# Patient Record
Sex: Female | Born: 1985 | Race: Black or African American | Hispanic: No | Smoking: Never smoker
Health system: Southern US, Community
[De-identification: ages and names within clinical notes are randomized; demographics above are authoritative.]

## PROBLEM LIST (undated history)

## (undated) DIAGNOSIS — F32A Depression, unspecified: Secondary | ICD-10-CM

## (undated) DIAGNOSIS — F1010 Alcohol abuse, uncomplicated: Secondary | ICD-10-CM

## (undated) DIAGNOSIS — F419 Anxiety disorder, unspecified: Secondary | ICD-10-CM

## (undated) DIAGNOSIS — S62609A Fracture of unspecified phalanx of unspecified finger, initial encounter for closed fracture: Secondary | ICD-10-CM

## (undated) DIAGNOSIS — F329 Major depressive disorder, single episode, unspecified: Secondary | ICD-10-CM

## (undated) HISTORY — DX: Depression, unspecified: F32.A

## (undated) HISTORY — DX: Major depressive disorder, single episode, unspecified: F32.9

---

## 2007-03-31 HISTORY — PX: GASTRIC BYPASS: SHX52

## 2012-02-21 ENCOUNTER — Encounter (HOSPITAL_COMMUNITY): Payer: Self-pay

## 2012-02-21 ENCOUNTER — Emergency Department (HOSPITAL_COMMUNITY)
Admission: EM | Admit: 2012-02-21 | Discharge: 2012-02-21 | Disposition: A | Discharge status: Home / Self Care | Payer: Medicaid Other | Source: Home / Self Care | Attending: Family Medicine | Admitting: Family Medicine

## 2012-02-21 DIAGNOSIS — K0889 Other specified disorders of teeth and supporting structures: Secondary | ICD-10-CM

## 2012-02-21 DIAGNOSIS — K089 Disorder of teeth and supporting structures, unspecified: Secondary | ICD-10-CM

## 2012-02-21 MED ORDER — GABAPENTIN 300 MG PO CAPS: 300.0000 mg | ORAL_CAPSULE | Freq: Three times a day (TID) | ORAL | Status: DC

## 2012-02-21 MED ORDER — KETOROLAC TROMETHAMINE 30 MG/ML IJ SOLN
30.0000 mg | Freq: Once | INTRAMUSCULAR | Status: AC
  Administered 2012-02-21: 30 mg via INTRAMUSCULAR

## 2012-02-21 MED ORDER — METHYLPREDNISOLONE 4 MG PO KIT: PACK | ORAL | Status: DC

## 2012-02-21 MED ORDER — KETOROLAC TROMETHAMINE 30 MG/ML IJ SOLN
INTRAMUSCULAR | Status: AC
  Filled 2012-02-21: qty 1

## 2012-02-21 NOTE — ED Notes (Signed)
Episodic HA since dental work 3 weeks ago; HA worse w lights, hot or cold food/beverages

## 2012-02-21 NOTE — ED Provider Notes (Signed)
History     CSN: [id number]  Arrival date & time 02/21/12  1043   First MD Initiated Contact with Patient 02/21/12 1107      Chief Complaint  Patient presents with  . Headache    (Consider location/radiation/quality/duration/timing/severity/associated sxs/prior treatment) Patient is a 26 y.o. female presenting with tooth pain. The history is provided by the patient.  Dental PainThe primary symptoms include mouth pain and headaches. The symptoms began more than 1 week ago (since 3 weeks ago receiving multiple injections for pain relief from dental filling procedure,). The symptoms are worsening. The symptoms occur constantly.  Additional symptoms include: dental sensitivity to temperature. Additional symptoms do not include: trismus, jaw pain, facial swelling and trouble swallowing.    History reviewed. No pertinent past medical history.  Past Surgical History  Procedure Date  . Abdominal surgery   . Gastric bypass     History reviewed. No pertinent family history.  History  Substance Use Topics  . Smoking status: Not on file  . Smokeless tobacco: Not on file  . Alcohol Use:     OB History    Grav Para Term Preterm Abortions TAB SAB Ect Mult Living                  Review of Systems  Constitutional: Negative.   HENT: Positive for dental problem. Negative for facial swelling and trouble swallowing.   Neurological: Positive for headaches.    Allergies  Diflucan  Home Medications   Current Outpatient Rx  Name  Route  Sig  Dispense  Refill  . CITALOPRAM HYDROBROMIDE 40 MG PO TABS   Oral   Take 40 mg by mouth daily.         Marland Kitchen VALACYCLOVIR HCL 1 G PO TABS   Oral   Take 500 mg by mouth daily.         Marland Kitchen GABAPENTIN 300 MG PO CAPS   Oral   Take 1 capsule (300 mg total) by mouth 3 (three) times daily.   30 capsule   0   . METHYLPREDNISOLONE 4 MG PO KIT      follow package directions, take all of medicine starting on Monday until finished   21 tablet   0     BP 127/84  Pulse 71  Temp 98.6 F (37 C) (Oral)  Resp 18  SpO2 100%  Physical Exam  Nursing note and vitals reviewed. Constitutional: She is oriented to person, place, and time. She appears well-developed and well-nourished.  HENT:  Head: Normocephalic.  Right Ear: External ear normal.  Left Ear: External ear normal.  Mouth/Throat: Oropharynx is clear and moist.       Dental hypersensitivity , no gingival sts or erythema.  Eyes: Conjunctivae normal are normal. Pupils are equal, round, and reactive to light.  Neck: Normal range of motion. Neck supple.  Lymphadenopathy:    She has no cervical adenopathy.  Neurological: She is alert and oriented to person, place, and time.  Skin: Skin is warm and dry.    ED Course  Procedures (including critical care time)  Labs Reviewed - No data to display No results found.   1. Pain, dental       MDM          Linna Hoff, MD 02/21/12 [phone]

## 2012-04-20 ENCOUNTER — Encounter (HOSPITAL_COMMUNITY): Payer: Self-pay | Admitting: Emergency Medicine

## 2012-04-20 ENCOUNTER — Emergency Department (HOSPITAL_COMMUNITY)
Admission: EM | Admit: 2012-04-20 | Discharge: 2012-04-20 | Disposition: A | Discharge status: Home / Self Care | Payer: Medicaid Other | Source: Home / Self Care

## 2012-04-20 ENCOUNTER — Emergency Department (HOSPITAL_COMMUNITY)
Admission: EM | Admit: 2012-04-20 | Discharge: 2012-04-20 | Disposition: A | Discharge status: Home / Self Care | Payer: Medicaid Other | Source: Home / Self Care | Attending: Emergency Medicine | Admitting: Emergency Medicine

## 2012-04-20 DIAGNOSIS — J069 Acute upper respiratory infection, unspecified: Secondary | ICD-10-CM

## 2012-04-20 MED ORDER — DIPHENHYDRAMINE HCL 25 MG PO CAPS: 25.0000 mg | ORAL_CAPSULE | Freq: Four times a day (QID) | ORAL | Status: DC | PRN

## 2012-04-20 MED ORDER — PSEUDOEPHEDRINE HCL 30 MG/5ML PO SYRP: 30.0000 mg | ORAL_SOLUTION | Freq: Four times a day (QID) | ORAL | Status: DC | PRN

## 2012-04-20 NOTE — ED Notes (Signed)
Pt c/o cold sx since yest Sx include: sinus pressure, sneezing, dry cough, itchy eyes, runny nose, nasal congestion Denies: f/v/n/d  She is alert w/no signs of acute distress.

## 2012-04-20 NOTE — ED Provider Notes (Signed)
Medical screening examination/treatment/procedure(s) were performed by resident-physician practitioner and as supervising physician I was immediately available for consultation/collaboration.  Raynald Blend, MD 04/20/12 [phone]

## 2012-04-20 NOTE — ED Provider Notes (Signed)
History     CSN: [id number]  Arrival date & time 04/20/12  1631   First MD Initiated Contact with Patient 04/20/12 1721      Chief Complaint  Patient presents with  . URI    (Consider location/radiation/quality/duration/timing/severity/associated sxs/prior treatment) Patient is a 27 y.o. female presenting with URI. The history is provided by the patient.  URI The primary symptoms include fatigue, sore throat and cough. Primary symptoms do not include fever, headaches, ear pain, swollen glands, wheezing, abdominal pain, nausea, vomiting, myalgias, arthralgias or rash. The current episode started yesterday. This is a new problem. The problem has been gradually worsening.  The sore throat is mild in intensity. The sore throat is not accompanied by trouble swallowing, drooling, hoarse voice or stridor.  The cough is non-productive.  The onset of the illness is associated with exposure to sick contacts. Symptoms associated with the illness include congestion and rhinorrhea. The illness is not associated with chills, plugged ear sensation, facial pain or sinus pressure.    History reviewed. No pertinent past medical history.  Past Surgical History  Procedure Date  . Abdominal surgery   . Gastric bypass     No family history on file.  History  Substance Use Topics  . Smoking status: Never Smoker   . Smokeless tobacco: Not on file  . Alcohol Use: Yes    OB History    Grav Para Term Preterm Abortions TAB SAB Ect Mult Living                  Review of Systems  Constitutional: Positive for fatigue. Negative for fever and chills.  HENT: Positive for congestion, sore throat and rhinorrhea. Negative for ear pain, hoarse voice, drooling, trouble swallowing and sinus pressure.   Eyes: Negative for discharge and visual disturbance.  Respiratory: Positive for cough. Negative for wheezing and stridor.   Cardiovascular: Negative for chest pain and leg swelling.  Gastrointestinal:  Negative for nausea, vomiting, abdominal pain and abdominal distention.  Genitourinary: Negative for flank pain.  Musculoskeletal: Negative for myalgias, back pain and arthralgias.  Skin: Negative for rash.  Neurological: Negative for light-headedness and headaches.  Hematological: Negative for adenopathy. Does not bruise/bleed easily.  Psychiatric/Behavioral: Negative for confusion and agitation.    Allergies  Diflucan  Home Medications   Current Outpatient Rx  Name  Route  Sig  Dispense  Refill  . CITALOPRAM HYDROBROMIDE 40 MG PO TABS   Oral   Take 40 mg by mouth daily.         Marland Kitchen VALACYCLOVIR HCL 1 G PO TABS   Oral   Take 500 mg by mouth daily.         Marland Kitchen GABAPENTIN 300 MG PO CAPS   Oral   Take 1 capsule (300 mg total) by mouth 3 (three) times daily.   30 capsule   0   . METHYLPREDNISOLONE 4 MG PO KIT      follow package directions, take all of medicine starting on Monday until finished   21 tablet   0     BP 129/88  Pulse 73  Temp 98.6 F (37 C) (Oral)  Resp 16  SpO2 100%  LMP 04/13/2012  Physical Exam  Constitutional: She is oriented to person, place, and time. She appears well-developed and well-nourished. No distress.  HENT:  Head: Normocephalic and atraumatic.  Right Ear: Tympanic membrane normal.  Left Ear: Tympanic membrane normal.  Nose: Mucosal edema and rhinorrhea present. No sinus tenderness. Right  sinus exhibits no maxillary sinus tenderness and no frontal sinus tenderness. Left sinus exhibits no maxillary sinus tenderness and no frontal sinus tenderness.  Mouth/Throat: Mucous membranes are normal. Normal dentition. No dental caries. No oropharyngeal exudate or posterior oropharyngeal edema.  Eyes: Conjunctivae normal and EOM are normal. Pupils are equal, round, and reactive to light.  Neck: Normal range of motion. Neck supple.  Cardiovascular: Normal rate and regular rhythm.  Exam reveals no gallop and no friction rub.   No murmur  heard. Pulmonary/Chest: Effort normal and breath sounds normal. No respiratory distress. She has no wheezes. She has no rales.  Abdominal: Soft. Bowel sounds are normal.  Musculoskeletal: Normal range of motion. She exhibits no edema.  Neurological: She is alert and oriented to person, place, and time.  Skin: Skin is warm and dry. She is not diaphoretic.    ED Course  Procedures (including critical care time)  Labs Reviewed - No data to display No results found.   No diagnosis found.    MDM  URI-symptomatic treatment discussed with patient. No red flags. Work note given.         Shelva Majestic, MD 04/20/12 [phone]

## 2012-06-15 ENCOUNTER — Encounter (HOSPITAL_COMMUNITY): Payer: Self-pay | Admitting: Emergency Medicine

## 2012-06-15 ENCOUNTER — Emergency Department (INDEPENDENT_AMBULATORY_CARE_PROVIDER_SITE_OTHER)
Admission: EM | Admit: 2012-06-15 | Discharge: 2012-06-15 | Disposition: A | Discharge status: Home / Self Care | Payer: PRIVATE HEALTH INSURANCE | Source: Home / Self Care | Attending: Emergency Medicine | Admitting: Emergency Medicine

## 2012-06-15 DIAGNOSIS — J069 Acute upper respiratory infection, unspecified: Secondary | ICD-10-CM

## 2012-06-15 HISTORY — DX: Anxiety disorder, unspecified: F41.9

## 2012-06-15 LAB — POCT RAPID STREP A: Streptococcus, Group A Screen (Direct): NEGATIVE

## 2012-06-15 MED ORDER — BENZONATATE 200 MG PO CAPS: 200.0000 mg | ORAL_CAPSULE | Freq: Three times a day (TID) | ORAL | Status: DC | PRN

## 2012-06-15 MED ORDER — FEXOFENADINE-PSEUDOEPHED ER 60-120 MG PO TB12: 1.0000 | ORAL_TABLET | Freq: Two times a day (BID) | ORAL | Status: DC

## 2012-06-15 NOTE — ED Notes (Signed)
Pt c/o cold sx onset 4 days Sx include: S.T, dysphagia, HA, cough w/yellow phlegm, nasal congestion, nauseas, diarrhea, loss of appetite Denies: fevers, vomiting Drinking fluids ok; Taking ibuprofen for the discomfort Sister sick and given antibiotics  She is alert w/no signs of acute respiratory distress.

## 2012-06-15 NOTE — ED Provider Notes (Signed)
Chief Complaint:   Chief Complaint  Patient presents with  . URI    History of Present Illness:   Lauren Love is a 27 year old female with a four-day history of headache, sore throat, cough productive yellow sputum, nasal congestion with bloody drainage, headache, sinus pressure, chills, weakness, anorexia, sore tongue, and popping or ears.  Review of Systems:  Other than noted above, the patient denies any of the following symptoms: Systemic:  No fevers, chills, sweats, weight loss or gain, fatigue, or tiredness. Eye:  No redness or discharge. ENT:  No ear pain, drainage, headache, nasal congestion, drainage, sinus pressure, difficulty swallowing, or sore throat. Neck:  No neck pain or swollen glands. Lungs:  No cough, sputum production, hemoptysis, wheezing, chest tightness, shortness of breath or chest pain. GI:  No abdominal pain, nausea, vomiting or diarrhea.  PMFSH:  Past medical history, family history, social history, meds, and allergies were reviewed.   Physical Exam:   Vital signs:  BP 132/89  Pulse 78  Temp(Src) 99 F (37.2 C) (Oral)  Resp 18  SpO2 100%  LMP 06/10/2012 General:  Alert and oriented.  In no distress.  Skin warm and dry. Eye:  No conjunctival injection or drainage. Lids were normal. ENT:  TMs and canals were normal, without erythema or inflammation.  Nasal mucosa was clear and uncongested, without drainage.  Mucous membranes were moist.  Pharynx was clear with no exudate or drainage.  There were no oral ulcerations or lesions. Neck:  Supple, no adenopathy, tenderness or mass. Lungs:  No respiratory distress.  Lungs were clear to auscultation, without wheezes, rales or rhonchi.  Breath sounds were clear and equal bilaterally.  Heart:  Regular rhythm, without gallops, murmers or rubs. Skin:  Clear, warm, and dry, without rash or lesions.   Labs:   Results for orders placed during the hospital encounter of 06/15/12  POCT RAPID STREP A ([facility] ONLY)       Result Value Range   Streptococcus, Group A Screen (Direct) NEGATIVE  NEGATIVE     Assessment:  The encounter diagnosis was Viral upper respiratory infection.  Plan:   1.  The following meds were prescribed:   Discharge Medication List as of 06/15/2012 12:21 PM    START taking these medications   Details  benzonatate (TESSALON) 200 MG capsule Take 1 capsule (200 mg total) by mouth 3 (three) times daily as needed for cough., Starting 06/15/2012, Until Discontinued, Normal    fexofenadine-pseudoephedrine (ALLEGRA-D) 60-120 MG per tablet Take 1 tablet by mouth every 12 (twelve) hours., Starting 06/15/2012, Until Discontinued, Normal       2.  The patient was instructed in symptomatic care and handouts were given. 3.  The patient was told to return if becoming worse in any way, if no better in 3 or 4 days, and given some red flag symptoms that would indicate earlier return.      Reuben Likes, MD 06/15/12 2141

## 2012-06-15 NOTE — ED Notes (Signed)
Pt would like Doctor's note

## 2012-08-27 ENCOUNTER — Other Ambulatory Visit (HOSPITAL_COMMUNITY)
Admission: RE | Admit: 2012-08-27 | Discharge: 2012-08-27 | Disposition: A | Discharge status: Home / Self Care | Payer: Self-pay | Source: Ambulatory Visit | Attending: Emergency Medicine | Admitting: Emergency Medicine

## 2012-08-27 ENCOUNTER — Emergency Department (INDEPENDENT_AMBULATORY_CARE_PROVIDER_SITE_OTHER)
Admission: EM | Admit: 2012-08-27 | Discharge: 2012-08-27 | Disposition: A | Discharge status: Home / Self Care | Payer: BC Managed Care – PPO | Source: Home / Self Care | Attending: Emergency Medicine | Admitting: Emergency Medicine

## 2012-08-27 ENCOUNTER — Encounter (HOSPITAL_COMMUNITY): Payer: Self-pay | Admitting: Emergency Medicine

## 2012-08-27 DIAGNOSIS — T192XXA Foreign body in vulva and vagina, initial encounter: Secondary | ICD-10-CM

## 2012-08-27 DIAGNOSIS — Z113 Encounter for screening for infections with a predominantly sexual mode of transmission: Secondary | ICD-10-CM | POA: Insufficient documentation

## 2012-08-27 DIAGNOSIS — N760 Acute vaginitis: Secondary | ICD-10-CM | POA: Insufficient documentation

## 2012-08-27 MED ORDER — METRONIDAZOLE 500 MG PO TABS: 500.0000 mg | ORAL_TABLET | Freq: Two times a day (BID) | ORAL | Status: DC

## 2012-08-27 MED ORDER — VALACYCLOVIR HCL 500 MG PO TABS: 500.0000 mg | ORAL_TABLET | Freq: Two times a day (BID) | ORAL | Status: DC

## 2012-08-27 NOTE — ED Notes (Signed)
Patient in gown and equipment at bedside

## 2012-08-27 NOTE — ED Provider Notes (Signed)
Chief Complaint:   Chief Complaint  Patient presents with  . Foreign Body in Vagina    History of Present Illness:   Lauren Love is a 27 year old female who presents today with a retained tampon. This is been present for about 2 days. She's had some discharge and some odor. She denies any pain or irritation. No fever, chills, nausea, vomiting, or urinary symptoms. Last menstrual period began a little over week ago. She's on a Mirena, but she would like to have this removed since she has about 2 menses per month. She also has a history of recurrent HSV. She was on Valtrex 500 mg per day for suppression, but then she moved to [place] has not been able to get this refilled. She would like to get back on it since she has an outbreak right now which came on with her menses.  Review of Systems:  Other than noted above, the patient denies any of the following symptoms: Systemic:  No fever, chills, sweats, or weight loss. GI:  No abdominal pain, nausea, anorexia, vomiting, diarrhea, constipation, melena or hematochezia. GU:  No dysuria, frequency, urgency, hematuria, vaginal discharge, itching, or abnormal vaginal bleeding. Skin:  No rash or itching.  PMFSH:  Past medical history, family history, social history, meds, and allergies were reviewed.  She is allergic to fluconazole. She takes Celexa.  Physical Exam:   Vital signs:  BP 119/84  Pulse 77  Temp(Src) 98.3 F (36.8 C) (Oral)  Resp 17  SpO2 99%  LMP 08/27/2012 General:  Alert, oriented and in no distress. Lungs:  Breath sounds clear and equal bilaterally.  No wheezes, rales or rhonchi. Heart:  Regular rhythm.  No gallops or murmers. Abdomen:  Soft, flat and non-distended.  No organomegaly or mass.  No tenderness, guarding or rebound.  Bowel sounds normally active. Pelvic exam:  Normal external genitalia. There is a retained tampon far up in the vaginal vault, near the cervix. This was easily removed with a ring forceps. Vaginal  mucosa was otherwise normal. There was no discharge but she did have some odor secondary to a retained tampon. The cervix appeared normal. No pain on cervical motion. Uterus was normal in size and shape and nontender. No adnexal masses or tenderness. Skin:  Clear, warm and dry.  Other Labs Obtained at Urgent Care Center:  DNA probes for gonorrhea, Chlamydia, Trichomonas, Gardnerella, Candida were obtained.  Results are pending at this time and we will call about any positive results.  Assessment:  The encounter diagnosis was Foreign body in vagina, initial encounter.  She had a tampon that was easily removed. She was given a refill for her Valtrex since this does seem to prevent the outbreaks of herpes simplex. I suggested she followup with a gynecologist for removal of the Mirena. Since she has some vaginal odor, we'll go ahead and treat with Flagyl for possible bacterial vaginosis as well.  Plan:   1.  The following meds were prescribed:   Discharge Medication List as of 08/27/2012  1:12 PM    START taking these medications   Details  metroNIDAZOLE (FLAGYL) 500 MG tablet Take 1 tablet (500 mg total) by mouth 2 (two) times daily., Starting 08/27/2012, Until Discontinued, Normal    !! valACYclovir (VALTREX) 500 MG tablet Take 1 tablet (500 mg total) by mouth 2 (two) times daily., Starting 08/27/2012, Until Discontinued, Normal     !! - Potential duplicate medications found. Please discuss with provider.     2.  The  patient was instructed in symptomatic care and handouts were given. 3.  The patient was told to return if becoming worse in any way, if no better in 3 or 4 days, and given some red flag symptoms such as pelvic pain or fever that would indicate earlier return. 4.  Follow up with a gynecologist for removal of the Mirena.    Reuben Likes, MD 08/27/12 1346

## 2012-08-27 NOTE — ED Notes (Signed)
Reports tampon in vagina since last night, unable to get tampon out.  Patient also requesting prescription for valcyclovir

## 2012-08-27 NOTE — ED Notes (Signed)
Patient dressing

## 2012-09-04 NOTE — ED Notes (Signed)
Final report of vaginal screening shows gardnerella positive, treated adquately; spoke w pt and advised of same

## 2012-09-04 NOTE — ED Notes (Signed)
Chart review.

## 2012-10-24 ENCOUNTER — Inpatient Hospital Stay (HOSPITAL_COMMUNITY)
Admission: AD | Admit: 2012-10-24 | Discharge: 2012-10-24 | Disposition: A | Discharge status: Home / Self Care | Payer: BC Managed Care – PPO | Source: Ambulatory Visit | Attending: Family Medicine | Admitting: Family Medicine

## 2012-10-24 DIAGNOSIS — L293 Anogenital pruritus, unspecified: Secondary | ICD-10-CM | POA: Insufficient documentation

## 2012-10-24 DIAGNOSIS — N926 Irregular menstruation, unspecified: Secondary | ICD-10-CM | POA: Insufficient documentation

## 2012-10-24 DIAGNOSIS — N949 Unspecified condition associated with female genital organs and menstrual cycle: Secondary | ICD-10-CM | POA: Insufficient documentation

## 2012-10-24 DIAGNOSIS — Z30431 Encounter for routine checking of intrauterine contraceptive device: Secondary | ICD-10-CM | POA: Insufficient documentation

## 2012-10-24 DIAGNOSIS — B373 Candidiasis of vulva and vagina: Secondary | ICD-10-CM

## 2012-10-24 DIAGNOSIS — B3731 Acute candidiasis of vulva and vagina: Secondary | ICD-10-CM

## 2012-10-24 LAB — URINALYSIS, ROUTINE W REFLEX MICROSCOPIC
Leukocytes, UA: NEGATIVE
Nitrite: NEGATIVE
Specific Gravity, Urine: 1.015 (ref 1.005–1.030)
Urobilinogen, UA: 4 mg/dL — ABNORMAL HIGH (ref 0.0–1.0)
pH: 7 (ref 5.0–8.0)

## 2012-10-24 LAB — URINE MICROSCOPIC-ADD ON

## 2012-10-24 LAB — WET PREP, GENITAL: Trich, Wet Prep: NONE SEEN

## 2012-10-24 MED ORDER — TERCONAZOLE 0.8 % VA CREA: 1.0000 | TOPICAL_CREAM | Freq: Every day | VAGINAL | Status: DC

## 2012-10-24 NOTE — MAU Provider Note (Signed)
Chart reviewed and agree with management and plan.  

## 2012-10-24 NOTE — MAU Note (Signed)
Patient is in with c/o vaginal irritation, worsen after intercourse. She also is considering removing her IUD, because she have vaginal bleeding every 2 weeks for 2 years.

## 2012-10-24 NOTE — MAU Provider Note (Signed)
History     CSN: [id number]  Arrival date and time: 10/24/12 1150   First Provider Initiated Contact with Patient 10/24/12 1222      Chief Complaint  Patient presents with  . Vaginal Discharge   HPI Ms. Lauren Love is a 27 y.o. female who presents to [facility] today with complaint of vaginal discharge and itching. The patient states white, mucus discharge without odor. She has irregular periods with the IUD and states periods every 2 weeks. She has had IUD x ~ 2 years. It was placed in [place], [place]. Patient is new to the area. She denies abdominal pain, N/V/D, UTI symptoms or fever.    OB History   Grav Para Term Preterm Abortions TAB SAB Ect Mult Living                  Past Medical History  Diagnosis Date  . Herpes   . Anxiety     Past Surgical History  Procedure Laterality Date  . Abdominal surgery    . Gastric bypass    . Cesarean section      No family history on file.  History  Substance Use Topics  . Smoking status: Never Smoker   . Smokeless tobacco: Not on file  . Alcohol Use: Yes    Allergies:  Allergies  Allergen Reactions  . Diflucan (Fluconazole) Hives    Prescriptions prior to admission  Medication Sig Dispense Refill  . citalopram (CELEXA) 40 MG tablet Take 40 mg by mouth daily.      Marland Kitchen desonide (DESOWEN) 0.05 % cream Apply 1 application topically 2 (two) times daily.      Marland Kitchen levonorgestrel (MIRENA) 20 MCG/24HR IUD 1 each by Intrauterine route once.      . valACYclovir (VALTREX) 1000 MG tablet Take 1,000 mg by mouth daily.         Review of Systems  Constitutional: Negative for fever and malaise/fatigue.  Gastrointestinal: Negative for nausea, vomiting, abdominal pain, diarrhea and constipation.  Genitourinary: Negative for dysuria, urgency and frequency.       Neg - vaginal bleeding + vaginal discharge   Physical Exam   Blood pressure 143/95, pulse 86, temperature 98.6 F (37 C), temperature source Oral, resp. rate 18, height 5\' 9"   (1.753 m), weight 198 lb 6 oz (89.982 kg), SpO2 98.00%.  Physical Exam  Constitutional: She is oriented to person, place, and time. She appears well-developed and well-nourished. No distress.  HENT:  Head: Normocephalic and atraumatic.  Cardiovascular: Normal rate, regular rhythm and normal heart sounds.   Respiratory: Effort normal and breath sounds normal. No respiratory distress.  GI: Soft. Bowel sounds are normal. She exhibits no distension and no mass. There is no tenderness. There is no rebound and no guarding.  Neurological: She is alert and oriented to person, place, and time.  Skin: Skin is warm and dry. No erythema.  Psychiatric: She has a normal mood and affect.   Results for orders placed during the hospital encounter of 10/24/12 (from the past 24 hour(s))  URINALYSIS, ROUTINE W REFLEX MICROSCOPIC     Status: Abnormal   Collection Time    10/24/12 12:00 PM      Result Value Range   Color, Urine YELLOW  YELLOW   APPearance CLEAR  CLEAR   Specific Gravity, Urine 1.015  1.005 - 1.030   pH 7.0  5.0 - 8.0   Glucose, UA NEGATIVE  NEGATIVE mg/dL   Hgb urine dipstick TRACE (*) NEGATIVE  Bilirubin Urine NEGATIVE  NEGATIVE   Ketones, ur NEGATIVE  NEGATIVE mg/dL   Protein, ur NEGATIVE  NEGATIVE mg/dL   Urobilinogen, UA 4.0 (*) 0.0 - 1.0 mg/dL   Nitrite NEGATIVE  NEGATIVE   Leukocytes, UA NEGATIVE  NEGATIVE  URINE MICROSCOPIC-ADD ON     Status: Abnormal   Collection Time    10/24/12 12:00 PM      Result Value Range   Squamous Epithelial / LPF FEW (*) RARE   RBC / HPF 3-6  <3 RBC/hpf   Bacteria, UA RARE  RARE   Urine-Other MUCOUS PRESENT    WET PREP, GENITAL     Status: Abnormal   Collection Time    10/24/12 12:30 PM      Result Value Range   Yeast Wet Prep HPF POC FEW (*) NONE SEEN   Trich, Wet Prep NONE SEEN  NONE SEEN   Clue Cells Wet Prep HPF POC NONE SEEN  NONE SEEN   WBC, Wet Prep HPF POC MODERATE (*) NONE SEEN    MAU Course  Procedures None  MDM UPT UA,  Wet prep and GC/Chlamydia today  Assessment and Plan  A: Irregular menses with IUD Yeast vulvovaginitis  P: Discharge home Rx for Terazol 3 sent to patient's pharmacy Referred to [facility] clinic for birth control counseling and possible removal of IUD Patient may return to [facility]U as needed or if her condition were to change or worsen  Freddi Starr, PA-C  10/24/2012, 1:33 PM

## 2012-11-21 ENCOUNTER — Telehealth: Payer: Self-pay

## 2012-11-21 NOTE — Telephone Encounter (Signed)
Pt called and stated that she has an appt scheduled on Sept 3rd and would like her celexa refilled cause she has not been taking it for a week. I called pt and I informed her that medication we could not fill due to her not being a patient of ours yet and that type of medication she would need a PCP to manage her while taking medication.  I advised her to call [facility] @ [phone] and set up an appt today for medication refill and management.  Pt stated understanding.

## 2012-11-30 ENCOUNTER — Ambulatory Visit: Payer: BC Managed Care – PPO | Attending: Internal Medicine | Admitting: Internal Medicine

## 2012-11-30 ENCOUNTER — Encounter: Payer: Self-pay | Admitting: Nurse Practitioner

## 2012-11-30 ENCOUNTER — Ambulatory Visit (INDEPENDENT_AMBULATORY_CARE_PROVIDER_SITE_OTHER): Payer: BC Managed Care – PPO | Admitting: Nurse Practitioner

## 2012-11-30 VITALS — BP 139/93 | HR 73 | Temp 96.5°F | Ht 69.0 in | Wt 196.2 lb

## 2012-11-30 VITALS — BP 129/81 | HR 67 | Temp 98.6°F | Resp 18 | Ht 69.0 in | Wt 196.8 lb

## 2012-11-30 DIAGNOSIS — Z975 Presence of (intrauterine) contraceptive device: Secondary | ICD-10-CM

## 2012-11-30 DIAGNOSIS — Z3049 Encounter for surveillance of other contraceptives: Secondary | ICD-10-CM

## 2012-11-30 DIAGNOSIS — R5381 Other malaise: Secondary | ICD-10-CM

## 2012-11-30 DIAGNOSIS — F329 Major depressive disorder, single episode, unspecified: Secondary | ICD-10-CM | POA: Insufficient documentation

## 2012-11-30 DIAGNOSIS — I10 Essential (primary) hypertension: Secondary | ICD-10-CM | POA: Insufficient documentation

## 2012-11-30 DIAGNOSIS — R6889 Other general symptoms and signs: Secondary | ICD-10-CM

## 2012-11-30 DIAGNOSIS — F411 Generalized anxiety disorder: Secondary | ICD-10-CM

## 2012-11-30 DIAGNOSIS — Z0000 Encounter for general adult medical examination without abnormal findings: Secondary | ICD-10-CM

## 2012-11-30 DIAGNOSIS — Z30432 Encounter for removal of intrauterine contraceptive device: Secondary | ICD-10-CM

## 2012-11-30 DIAGNOSIS — B009 Herpesviral infection, unspecified: Secondary | ICD-10-CM

## 2012-11-30 MED ORDER — CITALOPRAM HYDROBROMIDE 40 MG PO TABS: 40.0000 mg | ORAL_TABLET | Freq: Every day | ORAL | Status: DC

## 2012-11-30 MED ORDER — VALACYCLOVIR HCL 1 G PO TABS: 1000.0000 mg | ORAL_TABLET | Freq: Every day | ORAL | Status: DC

## 2012-11-30 MED ORDER — MEDROXYPROGESTERONE ACETATE 150 MG/ML IM SUSP
150.0000 mg | Freq: Once | INTRAMUSCULAR | Status: AC
  Administered 2012-11-30: 150 mg via INTRAMUSCULAR

## 2012-11-30 MED ORDER — HYDROCHLOROTHIAZIDE 25 MG PO TABS: 25.0000 mg | ORAL_TABLET | Freq: Every day | ORAL | Status: DC

## 2012-11-30 NOTE — Progress Notes (Signed)
Patient ID: Lauren Love, female   DOB: 08/21/1985, 27 y.o.   MRN: [id number]  CC: Anxiety  HPI: Lauren Love is a 27 year old female was seen in the OB/GYN clinic for IUD removal/resection of birth control pills earlier today. At that visit, she admitted to issues with alcohol abuse and was noted to have elevated blood pressure. She has issues with generalized anxiety, and has run out of her Celexa. Patient's only complaint today is some fatigue and cold intolerance.  Allergies  Allergen Reactions  . Diflucan [Fluconazole] Hives   Past Medical History  Diagnosis Date  . Herpes   . Anxiety    Current Outpatient Prescriptions on File Prior to Visit  Medication Sig Dispense Refill  . desonide (DESOWEN) 0.05 % cream Apply 1 application topically 2 (two) times daily.      . valACYclovir (VALTREX) 1000 MG tablet Take 1,000 mg by mouth daily.       . citalopram (CELEXA) 40 MG tablet Take 40 mg by mouth daily.       No current facility-administered medications on file prior to visit.   No family history on file. History   Social History  . Marital Status: Single    Spouse Name: N/A    Number of Children: N/A  . Years of Education: N/A   Occupational History  . Not on file.   Social History Main Topics  . Smoking status: Never Smoker   . Smokeless tobacco: Not on file  . Alcohol Use: Yes  . Drug Use: No  . Sexual Activity: Yes    Birth Control/ Protection: Condom, IUD   Other Topics Concern  . Not on file   Social History Narrative  . No narrative on file    Review of Systems: Constitutional: No fever or chills;  Appetite normal; No weight loss, +fatigue.  HEENT: No blurry vision or diplopia, no pharyngitis or dysphagia CV: No chest pain or arrhythmia.  Resp: No SOB, no cough. GI: No N/V, no diarrhea, no melena or hematochezia.  GU: No dysuria or hematuria.  MSK: no myalgias/arthralgias.  Neuro:  No headache or focal neurological deficits.  Psych: No depression or anxiety.   Endo: + Cold intolerance  Skin: No rashes or lesions.  Heme: No anemia or blood dyscrasia   Objective:   Filed Vitals:   11/30/12 1603  BP: 129/81  Pulse: 67  Temp: 98.6 F (37 C)  Resp: 18    Physical Exam  Constitutional: Appears well-developed and well-nourished. No distress.  HENT: Normocephalic. External right and left ear normal. Oropharynx is clear and moist.  Eyes: Conjunctivae and EOM are normal. PERRLA, no scleral icterus.  Neck: Normal ROM. Neck supple. No JVD. No tracheal deviation. No thyromegaly.  CVS: RRR, S1/S2 +, no murmurs, no gallops, no carotid bruit.  Pulmonary: Effort and breath sounds normal, no stridor, rhonchi, wheezes, rales.  Abdominal: Soft. BS +,  no distension, tenderness, rebound or guarding. Musculoskeletal: Normal range of motion. No edema and no tenderness.  Neuro: Alert. Normal reflexes, muscle tone coordination. No cranial nerve deficit. Skin: Skin is warm and dry. No rash noted. Not diaphoretic. No erythema. No pallor.  Psychiatric: Normal mood and affect. Behavior, judgment, thought content normal.      Assessment and plan:  1. Fatigue and malaise/cold intolerance: Check TSH and CBC rule out hypothyroidism and anemia. 2. History of herpes: Refill Valtrex. 3. Generalized anxiety disorder: Refill Celexa. 4. Hypertension: Start hydrochlorothiazide 25 mg daily. Check electrolytes. Follow up in one  month for blood pressure re-check.  Routine Health Maintenance   Ophthalmology Exam: Scheduled.  Lipid Screening Q 5 years: Ordered.  Breast Exam annually: Up to date.  PAP annually 21-30, Q 3 years > 30: Scheduled 12/2012.  HTN Annually: 11/30/2012.  Return to the clinic:   Signed:  Dr. Trula Ore Secilia Apps 11/30/2012 4:15 PM

## 2012-11-30 NOTE — Patient Instructions (Signed)
We have ordered blood tests which will need to be run on a fasting sample. Please come back this week at your earliest convenience to have these tests run which include a check of your thyroid gland, a lipid panel, and routine chemistries as well as blood counts.

## 2012-11-30 NOTE — Progress Notes (Signed)
History:  Lauren Love is a 27 y.o. No obstetric history on file. who presents to clinic today for Lauren Love IUD removal and restart birth control pills. Her BP is  elevated today and she admits it is often elevated. She also admits to some issues with alcohol.  She denies any migraine, clotting disorders. She has not had her annual exam as yet[place]  The following portions of the patient's history were reviewed and updated as appropriate: allergies, current medications, past family history, past medical history, past social history, past surgical history and problem list.  Review of Systems:    Objective:  Physical Exam BP 139/99  Pulse 73  Temp(Src) 96.5 F (35.8 C) (Oral)  Ht 5\' 9"  (1.753 m)  Wt 196 lb 3.2 oz (88.996 kg)  BMI 28.96 kg/m2  LMP 11/19/2012 GENERAL: Well-developed, well-nourished female in no acute distress.  HEENT: Normocephalic, atraumatic.  ABDOMEN: Soft, nontender, nondistended. No organomegaly. Normal bowel sounds appreciated in all quadrants.  PELVIC: Normal external female genitalia. Vagina is pink and rugated.  Normal discharge. Normal cervix contour. Pap smear obtained. Uterus is normal in size. No adnexal mass or tenderness. Lauren Love IUD removed without any difficulties.  EXTREMITIES: No cyanosis, clubbing, or edema, 2+ distal pulses.   Labs and Imaging No results found.  Assessment & Plan:  Assessment: Contraception  Plans: Mirenia IUD removal Start Depo Provera 150 mg IM today  Condoms for 2 weeks Return for annual exam asap  Lauren Serve, NP 11/30/2012 2:02 PM

## 2012-11-30 NOTE — Addendum Note (Signed)
Addended by: Sherre Lain A on: 11/30/2012 02:33 PM   Modules accepted: Orders

## 2012-11-30 NOTE — Patient Instructions (Signed)
Contraception Choices  Contraception (birth control) is the use of any methods or devices to prevent pregnancy. Below are some methods to help avoid pregnancy.  HORMONAL METHODS   · Contraceptive implant. This is a thin, plastic tube containing progesterone hormone. It does not contain estrogen hormone. Your caregiver inserts the tube in the inner part of the upper arm. The tube can remain in place for up to 3 years. After 3 years, the implant must be removed. The implant prevents the ovaries from releasing an egg (ovulation), thickens the cervical mucus which prevents sperm from entering the uterus, and thins the lining of the inside of the uterus.  · Progesterone-only injections. These injections are given every 3 months by your caregiver to prevent pregnancy. This synthetic progesterone hormone stops the ovaries from releasing eggs. It also thickens cervical mucus and changes the uterine lining. This makes it harder for sperm to survive in the uterus.  · Birth control pills. These pills contain estrogen and progesterone hormone. They work by stopping the egg from forming in the ovary (ovulation). Birth control pills are prescribed by a caregiver. Birth control pills can also be used to treat heavy periods.  · Minipill. This type of birth control pill contains only the progesterone hormone. They are taken every day of each month and must be prescribed by your caregiver.  · Birth control patch. The patch contains hormones similar to those in birth control pills. It must be changed once a week and is prescribed by a caregiver.  · Vaginal ring. The ring contains hormones similar to those in birth control pills. It is left in the vagina for 3 weeks, removed for 1 week, and then a new one is put back in place. The patient must be comfortable inserting and removing the ring from the vagina. A caregiver's prescription is necessary.  · Emergency contraception. Emergency contraceptives prevent pregnancy after unprotected  sexual intercourse. This pill can be taken right after sex or up to 5 days after unprotected sex. It is most effective the sooner you take the pills after having sexual intercourse. Emergency contraceptive pills are available without a prescription. Check with your pharmacist. Do not use emergency contraception as your only form of birth control.  BARRIER METHODS   · Female condom. This is a thin sheath (latex or rubber) that is worn over the penis during sexual intercourse. It can be used with spermicide to increase effectiveness.  · Female condom. This is a soft, loose-fitting sheath that is put into the vagina before sexual intercourse.  · Diaphragm. This is a soft, latex, dome-shaped barrier that must be fitted by a caregiver. It is inserted into the vagina, along with a spermicidal jelly. It is inserted before intercourse. The diaphragm should be left in the vagina for 6 to 8 hours after intercourse.  · Cervical cap. This is a round, soft, latex or plastic cup that fits over the cervix and must be fitted by a caregiver. The cap can be left in place for up to 48 hours after intercourse.  · Sponge. This is a soft, circular piece of polyurethane foam. The sponge has spermicide in it. It is inserted into the vagina after wetting it and before sexual intercourse.  · Spermicides. These are chemicals that kill or block sperm from entering the cervix and uterus. They come in the form of creams, jellies, suppositories, foam, or tablets. They do not require a prescription. They are inserted into the vagina with an applicator before having sexual intercourse.   The process must be repeated every time you have sexual intercourse.  INTRAUTERINE CONTRACEPTION  · Intrauterine device (IUD). This is a T-shaped device that is put in a woman's uterus during a menstrual period to prevent pregnancy. There are 2 types:  · Copper IUD. This type of IUD is wrapped in copper wire and is placed inside the uterus. Copper makes the uterus and  fallopian tubes produce a fluid that kills sperm. It can stay in place for 10 years.  · Hormone IUD. This type of IUD contains the hormone progestin (synthetic progesterone). The hormone thickens the cervical mucus and prevents sperm from entering the uterus, and it also thins the uterine lining to prevent implantation of a fertilized egg. The hormone can weaken or kill the sperm that get into the uterus. It can stay in place for 5 years.  PERMANENT METHODS OF CONTRACEPTION  · Female tubal ligation. This is when the woman's fallopian tubes are surgically sealed, tied, or blocked to prevent the egg from traveling to the uterus.  · Female sterilization. This is when the female has the tubes that carry sperm tied off (vasectomy). This blocks sperm from entering the vagina during sexual intercourse. After the procedure, the man can still ejaculate fluid (semen).  NATURAL PLANNING METHODS  · Natural family planning. This is not having sexual intercourse or using a barrier method (condom, diaphragm, cervical cap) on days the woman could become pregnant.  · Calendar method. This is keeping track of the length of each menstrual cycle and identifying when you are fertile.  · Ovulation method. This is avoiding sexual intercourse during ovulation.  · Symptothermal method. This is avoiding sexual intercourse during ovulation, using a thermometer and ovulation symptoms.  · Post-ovulation method. This is timing sexual intercourse after you have ovulated.  Regardless of which type or method of contraception you choose, it is important that you use condoms to protect against the transmission of sexually transmitted diseases (STDs). Talk with your caregiver about which form of contraception is most appropriate for you.  Document Released: 03/16/2005 Document Revised: 06/08/2011 Document Reviewed: 07/23/2010  [employer]® Patient Information ©2014 [employer].

## 2012-12-30 ENCOUNTER — Ambulatory Visit: Payer: BC Managed Care – PPO

## 2013-01-11 ENCOUNTER — Ambulatory Visit: Payer: BC Managed Care – PPO | Admitting: Obstetrics & Gynecology

## 2013-02-03 ENCOUNTER — Other Ambulatory Visit: Payer: Self-pay | Admitting: Internal Medicine

## 2013-02-16 ENCOUNTER — Encounter: Payer: Self-pay | Admitting: *Deleted

## 2013-12-31 ENCOUNTER — Other Ambulatory Visit: Payer: Self-pay | Admitting: Internal Medicine

## 2014-07-09 ENCOUNTER — Ambulatory Visit (INDEPENDENT_AMBULATORY_CARE_PROVIDER_SITE_OTHER): Payer: BLUE CROSS/BLUE SHIELD | Admitting: Primary Care

## 2014-07-09 ENCOUNTER — Encounter (INDEPENDENT_AMBULATORY_CARE_PROVIDER_SITE_OTHER): Payer: Self-pay

## 2014-07-09 ENCOUNTER — Encounter: Payer: Self-pay | Admitting: Primary Care

## 2014-07-09 VITALS — BP 136/88 | HR 77 | Temp 98.2°F | Ht 69.0 in | Wt 216.8 lb

## 2014-07-09 DIAGNOSIS — R030 Elevated blood-pressure reading, without diagnosis of hypertension: Secondary | ICD-10-CM

## 2014-07-09 DIAGNOSIS — B009 Herpesviral infection, unspecified: Secondary | ICD-10-CM

## 2014-07-09 DIAGNOSIS — F411 Generalized anxiety disorder: Secondary | ICD-10-CM

## 2014-07-09 DIAGNOSIS — F1010 Alcohol abuse, uncomplicated: Secondary | ICD-10-CM

## 2014-07-09 MED ORDER — LORAZEPAM 1 MG PO TABS: ORAL_TABLET | ORAL | Status: DC

## 2014-07-09 MED ORDER — LORAZEPAM 0.5 MG PO TABS: ORAL_TABLET | ORAL | Status: DC

## 2014-07-09 NOTE — Assessment & Plan Note (Signed)
Stable on citalopram 40mg  at this point. She is currently undergoing detox from alcohol. Once she is able to detox will further evaluate treatment effectives.

## 2014-07-09 NOTE — Assessment & Plan Note (Signed)
Stable in clinic today. Reports history of elevated readings, but nothing recent. Denies chest pain, headaches. Will continue to monitor.

## 2014-07-09 NOTE — Assessment & Plan Note (Signed)
Drinks 8 ounces of hard liquor daily. She is motivated to quit. RX provided today for Ativan taper to help her detox per the [facility] protocol. Multiple community resources provided to patient to help her detox. Education provided on detox symptoms of tremors, headaches, etc and instructed her to present to the emergency department if she experiences moderate-severe symptoms. She is to follow up in 3 days for re-evaluation.

## 2014-07-09 NOTE — Progress Notes (Signed)
Subjective:    Patient ID: Lauren Love, female    DOB: [birth year], 29 y.o.   MRN: [id number]  HPI  Mr. Luchsinger is a 29 year old female who presents today to establish care and discuss the problems mentioned below. Will obtain old records.  1) Alcohol Abuse: She is requesting assistance for detox from alcohol. She admits to drinking 8 ounces of 40/80 proof liquor every night and has been doing so consistently for the past 1.5 years, but has been drinking for a total of 3 years. Her beverages consist of mostly hard liquor but with occasional wine. She believes her drinking is related to anxiety issues, and started drinking when she was dating someone who drank frequently. She feels like her drinking is wearing down on her body and she's scared she may cause permanent damage. She's not been ovulating. She really wants to quit and is tired of feeling controlled by alcohol. She currently can't sleep if she doesn't have at least 1-2 drinks before bed. She has not tried any inpatient or outpatient treatment centers, and has not tried attending alcoholics anonymous. [facility] has been following her non-ovulatory patterns. She denies drug abuse.  2) Anxiety: She was treated with Lexapro about 10 years ago and was switched to citalopram about 5 years ago due to lack of effectiveness. She feels as though the citalopram  is helping because she "feels edgy" without the medication when missing doses. She has been going to [facility] for refills and management. Denies SI/HI. Denies depressive symptoms.  3) Elevated Blood Pressure Readings: History of elevated readings. BP in clinic today is okay. Denies headaches, chest pain. She was diagnosed with pre-eclampsia during her pregnancy 5 years ago.  BP Readings from Last 3 Encounters:  07/09/14 136/88  11/30/12 129/81  11/30/12 139/93    4) HSV2: Takes Valtrex . Gets outbreaks once every three months. Been on valtrex for 4-5 years. She  will notice her outbreaks sync with her menstrual cycle. She will take the tablets a few days before through a few days after menstruation which has helped to decrease outbreaks.  Review of Systems  Constitutional: Negative for fatigue and unexpected weight change.  HENT: Negative for rhinorrhea.   Respiratory: Negative for cough and shortness of breath.   Cardiovascular: Negative for chest pain.  Gastrointestinal: Negative for diarrhea and constipation.  Genitourinary: Positive for menstrual problem. Negative for dysuria and frequency.       Seeing OB/GYN for abnormal cycles.  Musculoskeletal: Negative for myalgias and arthralgias.       Occasional wrist "popping"  Skin: Negative for rash.  Neurological: Positive for weakness. Negative for dizziness and headaches.  Hematological: Negative for adenopathy.  Psychiatric/Behavioral:       See HPI for anxiety and depression.       Past Medical History  Diagnosis Date  . Herpes   . Anxiety   . Asthma   . Chicken pox   . Depression   . Eating disorder   . Hypertension     History   Social History  . Marital Status: Significant Other    Spouse Name: N/A  . Number of Children: N/A  . Years of Education: N/A   Occupational History  . Not on file.   Social History Main Topics  . Smoking status: Never Smoker   . Smokeless tobacco: Not on file  . Alcohol Use: 0.0 oz/week    0 Standard drinks or equivalent per week  .  Drug Use: No  . Sexual Activity: Yes    Birth Control/ Protection: Condom, IUD   Other Topics Concern  . Not on file   Social History Narrative   Engaged to be married.   Has one daughter. 30 years old.   Works at [employer].   Lives in [place].   Enjoys playing softball with [employer] team; baking with her daughter; being with fiance.     Past Surgical History  Procedure Laterality Date  . Abdominal surgery    . Gastric bypass  2009  . Cesarean section      Family History  Problem Relation Age of  Onset  . Alcohol abuse Mother   . Mental illness Mother   . Alcohol abuse Father   . Hypertension Father   . Cancer Maternal Grandmother     Breast Cancer  . Alcohol abuse Paternal Grandfather   . Cancer Mother     Pituitary    Allergies  Allergen Reactions  . Diflucan [Fluconazole] Hives    Current Outpatient Prescriptions on File Prior to Visit  Medication Sig Dispense Refill  . citalopram (CELEXA) 40 MG tablet Take 1 tablet (40 mg total) by mouth daily. 30 tablet 11  . desonide (DESOWEN) 0.05 % cream Apply 1 application topically 2 (two) times daily.    . valACYclovir (VALTREX) 1000 MG tablet Take 1 tablet (1,000 mg total) by mouth daily. 30 tablet 11   No current facility-administered medications on file prior to visit.    BP 136/88 mmHg  Pulse 77  Temp(Src) 98.2 F (36.8 C) (Oral)  Ht 5\' 9"  (1.753 m)  Wt 216 lb 12.8 oz (98.34 kg)  BMI 32.00 kg/m2  SpO2 98%  LMP 06/30/2014    Objective:   Physical Exam  Constitutional: She is oriented to person, place, and time. She appears well-developed.  HENT:  Head: Normocephalic.  Right Ear: External ear normal.  Left Ear: External ear normal.  Nose: Nose normal.  Mouth/Throat: Oropharynx is clear and moist. No oropharyngeal exudate.  Eyes: Pupils are equal, round, and reactive to light.  Neck: Neck supple.  Cardiovascular: Normal rate and regular rhythm.   Pulmonary/Chest: Effort normal and breath sounds normal.  Abdominal: Soft. Bowel sounds are normal. There is no hepatomegaly. There is no tenderness.  Lymphadenopathy:    She has no cervical adenopathy.  Neurological: She is alert and oriented to person, place, and time. She has normal reflexes. No cranial nerve deficit.  Skin: Skin is warm and dry.  Psychiatric: She has a normal mood and affect.          Assessment & Plan:  >45 minutes spent face to face with patient, >50% spent counseling or coordinating care.

## 2014-07-09 NOTE — Patient Instructions (Signed)
Start taking the Ativan as discussed.  I highly recommend searching for an Alcoholics Anonymous meeting in [place].  [email or web address]    Also, [facility] in [place] and [place].  Please schedule a follow up appointment for either Thursday or Friday this week.  If you being to feel signs of withdrawal, go to the emergency department.  Alcohol Withdrawal Anytime drug use is interfering with normal living activities it has become abuse. This includes problems with family and friends. Psychological dependence has developed when your mind tells you that the drug is needed. This is usually followed by physical dependence when a continuing increase of drugs are required to get the same feeling or "high." This is known as addiction or chemical dependency. A person's risk is much higher if there is a history of chemical dependency in the family. Mild Withdrawal Following Stopping Alcohol, When Addiction or Chemical Dependency Has Developed When a person has developed tolerance to alcohol, any sudden stopping of alcohol can cause uncomfortable physical symptoms. Most of the time these are mild and consist of tremors in the hands and increases in heart rate, breathing, and temperature. Sometimes these symptoms are associated with anxiety, panic attacks, and bad dreams. There may also be stomach upset. Normal sleep patterns are often interrupted with periods of inability to sleep (insomnia). This may last for 6 months. Because of this discomfort, many people choose to continue drinking to get rid of this discomfort and to try to feel normal. Severe Withdrawal with Decreased or No Alcohol Intake, When Addiction or Chemical Dependency Has Developed About five percent of alcoholics will develop signs of severe withdrawal when they stop using alcohol. One sign of this is development of generalized seizures (convulsions). Other signs of this are severe agitation and  confusion. This may be associated with believing in things which are not real or seeing things which are not really there (delusions and hallucinations). Vitamin deficiencies are usually present if alcohol intake has been long-term. Treatment for this most often requires hospitalization and close observation. Addiction can only be helped by stopping use of all chemicals. This is hard but may save your life. With continual alcohol use, possible outcomes are usually loss of self respect and esteem, violence, and death. Addiction cannot be cured but it can be stopped. This often requires outside help and the care of professionals. Treatment centers are listed in the yellow pages under Cocaine, Narcotics, and Alcoholics Anonymous. Most hospitals and clinics can refer you to a specialized care center. It is not necessary for you to go through the uncomfortable symptoms of withdrawal. Your caregiver can provide you with medicines that will help you through this difficult period. Try to avoid situations, friends, or drugs that made it possible for you to keep using alcohol in the past. Learn how to say no. It takes a long period of time to overcome addictions to all drugs, including alcohol. There may be many times when you feel as though you want a drink. After getting rid of the physical addiction and withdrawal, you will have a lessening of the craving which tells you that you need alcohol to feel normal. Call your caregiver if more support is needed. Learn who to talk to in your family and among your friends so that during these periods you can receive outside help. Alcoholics Anonymous (AA) has helped many people over the years. To get further help, contact AA or call your caregiver, counselor, or clergyperson. Al-Anon and Alateen  are support groups for friends and family members of an alcoholic. The people who love and care for an alcoholic often need help, too. For information about these organizations, check  your phone directory or call a local alcoholism treatment center.  SEEK IMMEDIATE MEDICAL CARE IF:   You have a seizure.  You have a fever.  You experience uncontrolled vomiting or you vomit up blood. This may be bright red or look like black coffee grounds.  You have blood in the stool. This may be bright red or appear as a black, tarry, bad-smelling stool.  You become lightheaded or faint. Do not drive if you feel this way. Have someone else drive you or call 409 for help.  You become more agitated or confused.  You develop uncontrolled anxiety.  You begin to see things that are not really there (hallucinate). Your caregiver has determined that you completely understand your medical condition, and that your mental state is back to normal. You understand that you have been treated for alcohol withdrawal, have agreed not to drink any alcohol for a minimum of 1 day, will not operate a car or other machinery for 24 hours, and have had an opportunity to ask any questions about your condition. Document Released: 12/24/2004 Document Revised: 06/08/2011 Document Reviewed: 11/02/2007 [facility] Patient Information 2015 [place], [place]. This information is not intended to replace advice given to you by your health care provider. Make sure you discuss any questions you have with your health care provider.

## 2014-07-09 NOTE — Assessment & Plan Note (Signed)
Stable. Takes Valtrex 1000mg  as needed for outbreaks that is managed by GYN. Outbreaks typically sync with her cycle.

## 2014-07-09 NOTE — Progress Notes (Signed)
Pre visit review using our clinic review tool, if applicable. No additional management support is needed unless otherwise documented below in the visit note. 

## 2014-07-12 ENCOUNTER — Encounter: Payer: Self-pay | Admitting: Primary Care

## 2014-07-12 ENCOUNTER — Ambulatory Visit (INDEPENDENT_AMBULATORY_CARE_PROVIDER_SITE_OTHER): Payer: BLUE CROSS/BLUE SHIELD | Admitting: Primary Care

## 2014-07-12 VITALS — BP 132/92 | HR 78 | Temp 98.0°F | Ht 69.0 in | Wt 214.8 lb

## 2014-07-12 DIAGNOSIS — F1010 Alcohol abuse, uncomplicated: Secondary | ICD-10-CM | POA: Diagnosis not present

## 2014-07-12 NOTE — Assessment & Plan Note (Signed)
Stable in office. No withdrawal symptoms. Decreased alcohol consumption over the past 3 days, but continues to drink. Denies SI/HI. She is also drinking while taking the Ativan for which she was instructed not to do so. Her fiance will be taking her for detox treatment at [facility]. They have been instructed to go there immediately and promise to go sometime this evening. [facility] ED to notify them of her arrival. Will call [facility] and have them shred the remaining Ativan prescriptions. She is to follow up with me after treatment. Work note provided.

## 2014-07-12 NOTE — Patient Instructions (Signed)
Please go to the Emergency Department today or as soon as possible for evaluation of detox.  Check in with the Triage nurse and tell them you are needing detox from alcohol. Do not take the Ativan while drinking. Please make a follow up appointment with me when you are released. Take care!

## 2014-07-12 NOTE — Progress Notes (Signed)
Subjective:    Patient ID: Montey Horaourtney Sermons, female    DOB: 1985/05/17, 29 y.o.   MRN: [id number]  HPI  [name] is a 29 year old female who presents today for follow up. She was evaluated on Monday as a new patient and reqested detox treatment from alcohol. She's been drinking 8 ounces of liquor daily for the past 1.5 years and is determined to quit.  Monday she was provided with a script for detox treatment with an ativan taper. She stated at 11am Monday and took a total of 3 doses. Later that night she still felt the urge to drink and had about 1oz of liquor. On Tuesday she completed the prescribed dose but felt tired at work throughout the day. She consumed 3-4 oz of liquor that night. Wednesday she had a few doses of her medication and consumed 4 oz of liquor.   Today she's very interested in an inpatient facility and believes she cannot detox at home with the temptation of alcohol. She denies tremors, palpitations, sweats, cramping, abdominal pain, nausea, or vomiting. Denies SI/HI. Her fiance is present.  Review of Systems  Respiratory: Negative for shortness of breath.   Cardiovascular: Negative for chest pain and palpitations.  Gastrointestinal: Negative for nausea, vomiting and abdominal pain.  Musculoskeletal: Negative for myalgias.  Neurological: Negative for tremors, light-headedness and headaches.  Psychiatric/Behavioral: Negative for suicidal ideas, hallucinations, confusion, sleep disturbance and self-injury. The patient is not nervous/anxious.        Past Medical History  Diagnosis Date  . Herpes   . Anxiety   . Asthma   . Chicken pox   . Depression   . Eating disorder   . Hypertension     History   Social History  . Marital Status: Significant Other    Spouse Name: N/A  . Number of Children: N/A  . Years of Education: N/A   Occupational History  . Not on file.   Social History Main Topics  . Smoking status: Never Smoker   . Smokeless tobacco: Not on  file  . Alcohol Use: 0.0 oz/week    0 Standard drinks or equivalent per week  . Drug Use: No  . Sexual Activity: Yes    Birth Control/ Protection: Condom, IUD   Other Topics Concern  . Not on file   Social History Narrative   Engaged to be married.   Has one daughter. 30 years old.   Works at [employer].   [place]ives in [place].   Enjoys playing softball with [employer] team; baking with her daughter; being with fiance.     Past Surgical History  Procedure Laterality Date  . Abdominal surgery    . Gastric bypass  2009  . Cesarean section      Family History  Problem Relation Age of Onset  . Alcohol abuse Mother   . Mental illness Mother   . Alcohol abuse Father   . Hypertension Father   . Cancer Maternal Grandmother     Breast Cancer  . Alcohol abuse Paternal Grandfather   . Cancer Mother     Pituitary    Allergies  Allergen Reactions  . Diflucan [Fluconazole] Hives    Current Outpatient Prescriptions on File Prior to Visit  Medication Sig Dispense Refill  . ACAI PO Take 1 capsule by mouth daily.    . Calcium 150 MG TABS Take 1 tablet by mouth daily.    . citalopram (CELEXA) 40 MG tablet Take 1 tablet (40 mg total)  by mouth daily. 30 tablet 11  . desonide (DESOWEN) 0.05 % cream Apply 1 application topically 2 (two) times daily.    . DHA-EPA-Coenzyme Q10-Vitamin E 120-180-50-30 CAPS Take 1 capsule by mouth daily.    . Ferrous Sulfate (IRON) 325 (65 FE) MG TABS Take 1 capsule by mouth daily.    Melene Muller ON 07/24/2014] LORazepam (ATIVAN) 0.5 MG tablet Take one tablet by mouth twice daily for three days 6 tablet 0  . [START ON 07/27/2014] LORazepam (ATIVAN) 0.5 MG tablet Take one tablet by mouth daily for three days. 3 tablet 0  . LORazepam (ATIVAN) 1 MG tablet Take 2 tablets by mouth four times daily for three days. 24 tablet 0  . LORazepam (ATIVAN) 1 MG tablet Take 2 tablets by mouth three times daily for three days. 18 tablet 0  . [START ON 07/15/2014] LORazepam (ATIVAN) 1  MG tablet Take 2 tablets by mouth twice daily for three days. 12 tablet 0  . [START ON 07/18/2014] LORazepam (ATIVAN) 1 MG tablet Take one tablet by mouth three times daily for three days. 9 tablet 0  . [START ON 07/21/2014] LORazepam (ATIVAN) 1 MG tablet Take one tablet by mouth twice daily for three days. 6 tablet 0  . valACYclovir (VALTREX) 1000 MG tablet Take 1 tablet (1,000 mg total) by mouth daily. 30 tablet 11   No current facility-administered medications on file prior to visit.    BP 132/92 mmHg  Pulse 78  Temp(Src) 98 F (36.7 C) (Oral)  Ht  (1.753 m)  Wt 214 lb 12.8 oz (97.433 kg)  BMI 31.71 kg/m2  SpO2 99%  LMP 06/30/2014    Objective:   Physical Exam  Constitutional: She is oriented to person, place, and time. She appears well-developed.  Eyes: Pupils are equal, round, and reactive to light.  Neck: Neck supple.  Cardiovascular: Normal rate, regular rhythm and normal heart sounds.   Pulmonary/Chest: Effort normal and breath sounds normal.  Abdominal: Soft. Bowel sounds are normal. There is no tenderness.  Lymphadenopathy:    She has no cervical adenopathy.  Neurological: She is alert and oriented to person, place, and time. No cranial nerve deficit. Coordination normal.  Skin: Skin is warm and dry.  Psychiatric: She has a normal mood and affect.          Assessment & Plan:

## 2014-07-12 NOTE — Progress Notes (Signed)
Pre visit review using our clinic review tool, if applicable. No additional management support is needed unless otherwise documented below in the visit note. 

## 2014-07-13 ENCOUNTER — Ambulatory Visit: Payer: Self-pay | Admitting: Primary Care

## 2014-07-13 ENCOUNTER — Telehealth: Payer: Self-pay

## 2014-07-13 ENCOUNTER — Emergency Department
Admit: 2014-07-13 | Disposition: A | Discharge status: Home / Self Care | Payer: Self-pay | Admitting: Emergency Medicine

## 2014-07-13 LAB — DRUG SCREEN, URINE
AMPHETAMINES, UR SCREEN: NEGATIVE
BARBITURATES, UR SCREEN: NEGATIVE
BENZODIAZEPINE, UR SCRN: POSITIVE
Cannabinoid 50 Ng, Ur ~~LOC~~: NEGATIVE
Cocaine Metabolite,Ur ~~LOC~~: NEGATIVE
MDMA (Ecstasy)Ur Screen: NEGATIVE
METHADONE, UR SCREEN: NEGATIVE
Opiate, Ur Screen: NEGATIVE
Phencyclidine (PCP) Ur S: NEGATIVE
Tricyclic, Ur Screen: NEGATIVE

## 2014-07-13 LAB — URINALYSIS, COMPLETE
BACTERIA: NONE SEEN
Bilirubin,UR: NEGATIVE
Blood: NEGATIVE
GLUCOSE, UR: NEGATIVE mg/dL (ref 0–75)
Ketone: NEGATIVE
LEUKOCYTE ESTERASE: NEGATIVE
NITRITE: NEGATIVE
Ph: 5 (ref 4.5–8.0)
Protein: NEGATIVE
Specific Gravity: 1.027 (ref 1.003–1.030)

## 2014-07-13 LAB — COMPREHENSIVE METABOLIC PANEL
ALBUMIN: 4.1 g/dL
ALK PHOS: 52 U/L
Anion Gap: 6 — ABNORMAL LOW (ref 7–16)
BILIRUBIN TOTAL: 0.3 mg/dL
BUN: 17 mg/dL
CHLORIDE: 107 mmol/L
CREATININE: 0.75 mg/dL
Calcium, Total: 8.3 mg/dL — ABNORMAL LOW
Co2: 24 mmol/L
EGFR (African American): >60
GLUCOSE: 158 mg/dL — AB
Potassium: 3.9 mmol/L
SGOT(AST): 37 U/L
SGPT (ALT): 33 U/L
Sodium: 137 mmol/L
TOTAL PROTEIN: 7.1 g/dL

## 2014-07-13 LAB — CBC
HCT: 34.3 % — ABNORMAL LOW (ref 35.0–47.0)
HGB: 10.6 g/dL — ABNORMAL LOW (ref 12.0–16.0)
MCH: 24.1 pg — AB (ref 26.0–34.0)
MCHC: 31 g/dL — AB (ref 32.0–36.0)
MCV: 78 fL — ABNORMAL LOW (ref 80–100)
Platelet: 247 10*3/uL (ref 150–440)
RBC: 4.41 10*6/uL (ref 3.80–5.20)
RDW: 18.8 % — ABNORMAL HIGH (ref 11.5–14.5)
WBC: 5.5 10*3/uL (ref 3.6–11.0)

## 2014-07-13 LAB — ETHANOL: Ethanol: 54 mg/dL

## 2014-07-13 LAB — ACETAMINOPHEN LEVEL

## 2014-07-13 LAB — SALICYLATE LEVEL: Salicylates, Serum: <4 mg/dL

## 2014-07-13 NOTE — Telephone Encounter (Signed)
[name] Love [phone](M) [phone] [name](W)  Toni AmendCourtney stop by and wanted to let you know that she was admitting herself into [facility] between 8-9 tonight.

## 2014-07-13 NOTE — Telephone Encounter (Signed)
Called and notified [facility] emergency department that patient would be arriving between 8-9 tonight. They are expecting her.

## 2014-07-16 NOTE — Telephone Encounter (Signed)
Pt left v/m; pt did not go for admission; pt thinks she can take care of her alcohol problem on her own at home; pt does have insomnia when does not drink and wants med like lunesta to help pt sleep. Pt request [email or web address]. Pt seen 07/12/14.

## 2014-07-16 NOTE — Telephone Encounter (Signed)
Please notify Lauren Love that she may take Melatonin, which may be purchased over the counter, for sleep. I still think she would benefit from an inpatient or outpatient treatment program. I would like to see her for follow up in 2 weeks if she's not going into a treatment program. Please schedule.  Thanks!

## 2014-07-16 NOTE — Telephone Encounter (Signed)
Called and notified patient of Kate's comments. Patient verbalized understanding. Patient wanted to see Jae DireKate sooner then 2 weeks. Appt is schedule for 07/20/14 at 7:15 am.

## 2014-07-20 ENCOUNTER — Encounter: Payer: Self-pay | Admitting: *Deleted

## 2014-07-20 ENCOUNTER — Ambulatory Visit (INDEPENDENT_AMBULATORY_CARE_PROVIDER_SITE_OTHER): Payer: BLUE CROSS/BLUE SHIELD | Admitting: Primary Care

## 2014-07-20 ENCOUNTER — Encounter: Payer: Self-pay | Admitting: Primary Care

## 2014-07-20 VITALS — BP 126/84 | HR 77 | Temp 98.2°F | Ht 69.0 in | Wt 217.8 lb

## 2014-07-20 DIAGNOSIS — F1010 Alcohol abuse, uncomplicated: Secondary | ICD-10-CM

## 2014-07-20 LAB — COMPREHENSIVE METABOLIC PANEL
ALBUMIN: 4.2 g/dL (ref 3.5–5.2)
ALT: 25 U/L (ref 0–35)
AST: 29 U/L (ref 0–37)
Alkaline Phosphatase: 54 U/L (ref 39–117)
BUN: 14 mg/dL (ref 6–23)
CO2: 28 mEq/L (ref 19–32)
Calcium: 8.9 mg/dL (ref 8.4–10.5)
Chloride: 105 mEq/L (ref 96–112)
Creatinine, Ser: 0.81 mg/dL (ref 0.40–1.20)
GFR: 107.26 mL/min (ref >60.00)
GLUCOSE: 82 mg/dL (ref 70–99)
POTASSIUM: 4.5 meq/L (ref 3.5–5.1)
Sodium: 137 mEq/L (ref 135–145)
Total Bilirubin: 0.3 mg/dL (ref 0.2–1.2)
Total Protein: 7.1 g/dL (ref 6.0–8.3)

## 2014-07-20 LAB — CBC WITH DIFFERENTIAL/PLATELET
BASOS ABS: 0 10*3/uL (ref 0.0–0.1)
Basophils Relative: 0.6 % (ref 0.0–3.0)
Eosinophils Absolute: 0.1 10*3/uL (ref 0.0–0.7)
Eosinophils Relative: 1.5 % (ref 0.0–5.0)
HEMATOCRIT: 34.3 % — AB (ref 36.0–46.0)
HEMOGLOBIN: 10.9 g/dL — AB (ref 12.0–15.0)
LYMPHS ABS: 1.7 10*3/uL (ref 0.7–4.0)
Lymphocytes Relative: 36 % (ref 12.0–46.0)
MCHC: 32 g/dL (ref 30.0–36.0)
MCV: 77 fl — ABNORMAL LOW (ref 78.0–100.0)
MONOS PCT: 7.3 % (ref 3.0–12.0)
Monocytes Absolute: 0.3 10*3/uL (ref 0.1–1.0)
Neutro Abs: 2.6 10*3/uL (ref 1.4–7.7)
Neutrophils Relative %: 54.6 % (ref 43.0–77.0)
Platelets: 273 10*3/uL (ref 150.0–400.0)
RBC: 4.45 Mil/uL (ref 3.87–5.11)
RDW: 19.9 % — AB (ref 11.5–15.5)
WBC: 4.7 10*3/uL (ref 4.0–10.5)

## 2014-07-20 MED ORDER — LORAZEPAM 1 MG PO TABS: ORAL_TABLET | ORAL | Status: DC

## 2014-07-20 NOTE — Progress Notes (Signed)
Subjective:    Patient ID: Lauren Love, female    DOB: 1986-03-12, 29 y.o.   MRN: [id number]  HPI  Ms. Lauren Love is a 29 year old female who presents today for follow up of detox. She was initially seen on 07/09/14 requesting assistance with detoxification from alcohol. She will drink 8 ounces of 40/80 proof at night, especially before bedtime. She was provided with tapering scripts of Ativan with explicit instructions on how to detox. She was revaluated on 07/12/14 and it was determined that she had continued to drink a reduced amount while taking the Ativan. She expressed that she could not detox at home and needed inpatient treatment. We arranged for her to be seen at [facility], so she [facility] into [facility] on Friday night (4/15) and had a bad experience in the emergency department. She felt anxious because they took her clothes, placed her in an area with other psych patients and placed her in the hallway for hours. She was accepted to an inpatient facility but left the emergency department prior to transport due to feeling "traumatized" as she wanted to go home. She continued to drink Saturday and Sunday night at a reduced amount. She was able to refrain from drinking Monday and Tuesday night after taking the left over Ativan (that evening) that was previously prescribed.  She'll mainly crave a drink at night and denies cravings during the day. She believes the Ativan to be helping to reduce the cravings. She didn't take any Ativan on Wednesday or Thursday and found that she was unable to sleep which led to drinking her typical amount of alcohol. She tried taking Melatonin which did not help. She typically craves a drink around 5:30- 6pm. She has a pamphlet on several community alcohol groups and plans on attending AA meetings.  Review of Systems  Constitutional: Negative for chills.  Eyes: Negative for visual disturbance.  Respiratory: Negative for shortness of breath.   Cardiovascular: Negative for  chest pain.  Gastrointestinal: Negative for nausea and vomiting.  Musculoskeletal: Negative for myalgias and arthralgias.  Neurological: Negative for dizziness, tremors, syncope, numbness and headaches.  Psychiatric/Behavioral: Positive for sleep disturbance. Negative for suicidal ideas, hallucinations, confusion and self-injury. The patient is nervous/anxious. The patient is not hyperactive.        Past Medical History  Diagnosis Date  . Herpes   . Anxiety   . Asthma   . Chicken pox   . Depression   . Eating disorder   . Hypertension     History   Social History  . Marital Status: Significant Other    Spouse Name: N/A  . Number of Children: N/A  . Years of Education: N/A   Occupational History  . Not on file.   Social History Main Topics  . Smoking status: Never Smoker   . Smokeless tobacco: Not on file  . Alcohol Use: 0.0 oz/week    0 Standard drinks or equivalent per week  . Drug Use: No  . Sexual Activity: Yes    Birth Control/ Protection: Condom, IUD   Other Topics Concern  . Not on file   Social History Narrative   Engaged to be married.   Has one daughter. 30 years old.   Works at [employer].   [place]ives in [place].   Enjoys playing softball with [employer] team; baking with her daughter; being with fiance.     Past Surgical History  Procedure Laterality Date  . Abdominal surgery    . Gastric bypass  2009  . Cesarean section      Family History  Problem Relation Age of Onset  . Alcohol abuse Mother   . Mental illness Mother   . Alcohol abuse Father   . Hypertension Father   . Cancer Maternal Grandmother     Breast Cancer  . Alcohol abuse Paternal Grandfather   . Cancer Mother     Pituitary    Allergies  Allergen Reactions  . Diflucan [Fluconazole] Hives    Current Outpatient Prescriptions on File Prior to Visit  Medication Sig Dispense Refill  . ACAI PO Take 1 capsule by mouth daily.    . Calcium 150 MG TABS Take 1 tablet by mouth daily.     . citalopram (CELEXA) 40 MG tablet Take 1 tablet (40 mg total) by mouth daily. 30 tablet 11  . desonide (DESOWEN) 0.05 % cream Apply 1 application topically 2 (two) times daily.    . DHA-EPA-Coenzyme Q10-Vitamin E 120-180-50-30 CAPS Take 1 capsule by mouth daily.    . Ferrous Sulfate (IRON) 325 (65 FE) MG TABS Take 1 capsule by mouth daily.    . valACYclovir (VALTREX) 1000 MG tablet Take 1 tablet (1,000 mg total) by mouth daily. 30 tablet 11   No current facility-administered medications on file prior to visit.    BP 126/84 mmHg  Pulse 77  Temp(Src) 98.2 F (36.8 C) (Oral)  Ht  (1.753 m)  Wt 217 lb 12.8 oz (98.793 kg)  BMI 32.15 kg/m2  SpO2 99%  LMP 06/30/2014    Objective:   Physical Exam  Constitutional: She is oriented to person, place, and time. She appears well-developed.  Appears calm. CIWA score of 0.  Eyes: Pupils are equal, round, and reactive to light.  Cardiovascular: Normal rate, regular rhythm and normal heart sounds.   Pulmonary/Chest: Effort normal and breath sounds normal.  Abdominal: Soft. Bowel sounds are normal. There is no tenderness.  Musculoskeletal: Normal range of motion.  Neurological: She is alert and oriented to person, place, and time. She has normal reflexes.  Skin: Skin is warm and dry.  Psychiatric: She has a normal mood and affect.          Assessment & Plan:

## 2014-07-20 NOTE — Progress Notes (Signed)
Pre visit review using our clinic review tool, if applicable. No additional management support is needed unless otherwise documented below in the visit note. 

## 2014-07-20 NOTE — Assessment & Plan Note (Addendum)
Checked into [facility] and left after being placed into an inpatient facility due to a "traumatizing" experience in the emergency department. She is motivated to detox at home. Cravings are only at night after work. She used the remaining ativan tablets she had at home for sleep 2 nights last week which helped her not to drink. RX for Ativan, 2 mg at bedtime for one week. Will do gradual taper down over the next several weeks.  CIWA score 0 in office today. Denies SI/HI. Instructions provided regarding no alcohol intake while taking the ativan. She verbalized understanding. She is taking daily mulitvitamin, iron, and calcium. She is to check into a community alcohol program and has decided to attend an AA meeting next week, which is part of our plan. Follow up next Friday for re-evaluation. UDS and controlled substance contract obtained today.

## 2014-07-20 NOTE — Patient Instructions (Signed)
Take 2 tablets of ativan in the evening when you arrive at home from work to prevent alcohol cravings. You may also try taking 1 tablet when you come home from work and 1 tablet at bedtime.  Follow up in 1 week for re-evaluation. It was great to see you again.  Alcohol Withdrawal Anytime drug use is interfering with normal living activities it has become abuse. This includes problems with family and friends. Psychological dependence has developed when your mind tells you that the drug is needed. This is usually followed by physical dependence when a continuing increase of drugs are required to get the same feeling or "high." This is known as addiction or chemical dependency. A person's risk is much higher if there is a history of chemical dependency in the family. Mild Withdrawal Following Stopping Alcohol, When Addiction or Chemical Dependency Has Developed When a person has developed tolerance to alcohol, any sudden stopping of alcohol can cause uncomfortable physical symptoms. Most of the time these are mild and consist of tremors in the hands and increases in heart rate, breathing, and temperature. Sometimes these symptoms are associated with anxiety, panic attacks, and bad dreams. There may also be stomach upset. Normal sleep patterns are often interrupted with periods of inability to sleep (insomnia). This may last for 6 months. Because of this discomfort, many people choose to continue drinking to get rid of this discomfort and to try to feel normal. Severe Withdrawal with Decreased or No Alcohol Intake, When Addiction or Chemical Dependency Has Developed About five percent of alcoholics will develop signs of severe withdrawal when they stop using alcohol. One sign of this is development of generalized seizures (convulsions). Other signs of this are severe agitation and confusion. This may be associated with believing in things which are not real or seeing things which are not really there (delusions  and hallucinations). Vitamin deficiencies are usually present if alcohol intake has been long-term. Treatment for this most often requires hospitalization and close observation. Addiction can only be helped by stopping use of all chemicals. This is hard but may save your life. With continual alcohol use, possible outcomes are usually loss of self respect and esteem, violence, and death. Addiction cannot be cured but it can be stopped. This often requires outside help and the care of professionals. Treatment centers are listed in the yellow pages under Cocaine, Narcotics, and Alcoholics Anonymous. Most hospitals and clinics can refer you to a specialized care center. It is not necessary for you to go through the uncomfortable symptoms of withdrawal. Your caregiver can provide you with medicines that will help you through this difficult period. Try to avoid situations, friends, or drugs that made it possible for you to keep using alcohol in the past. Learn how to say no. It takes a long period of time to overcome addictions to all drugs, including alcohol. There may be many times when you feel as though you want a drink. After getting rid of the physical addiction and withdrawal, you will have a lessening of the craving which tells you that you need alcohol to feel normal. Call your caregiver if more support is needed. Learn who to talk to in your family and among your friends so that during these periods you can receive outside help. Alcoholics Anonymous (AA) has helped many people over the years. To get further help, contact AA or call your caregiver, counselor, or clergyperson. Al-Anon and Alateen are support groups for friends and family members of an alcoholic.  The people who love and care for an alcoholic often need help, too. For information about these organizations, check your phone directory or call a local alcoholism treatment center.  SEEK IMMEDIATE MEDICAL CARE IF:   You have a seizure.  You have  a fever.  You experience uncontrolled vomiting or you vomit up blood. This may be bright red or look like black coffee grounds.  You have blood in the stool. This may be bright red or appear as a black, tarry, bad-smelling stool.  You become lightheaded or faint. Do not drive if you feel this way. Have someone else drive you or call [phone] for help.  You become more agitated or confused.  You develop uncontrolled anxiety.  You begin to see things that are not really there (hallucinate). Your caregiver has determined that you completely understand your medical condition, and that your mental state is back to normal. You understand that you have been treated for alcohol withdrawal, have agreed not to drink any alcohol for a minimum of 1 day, will not operate a car or other machinery for 24 hours, and have had an opportunity to ask any questions about your condition. Document Released: 12/24/2004 Document Revised: 06/08/2011 Document Reviewed: 11/02/2007 [facility] Patient Information 2015 [employer] [place]. This information is not intended to replace advice given to you by your health care provider. Make sure you discuss any questions you have with your health care provider.

## 2014-07-25 ENCOUNTER — Telehealth: Payer: Self-pay | Admitting: *Deleted

## 2014-07-25 NOTE — Telephone Encounter (Signed)
Since she's not coming in this Friday, will you please get an update on how she's doing with her alcohol consumption.  Is she drinking daily? Is she experiencing any withdrawal symptoms?  Thank you.

## 2014-07-25 NOTE — Telephone Encounter (Signed)
FYI:  Patient called to let you know that she rescheduled this week's appointment, she had more medication left than she had originally thought.

## 2014-07-26 NOTE — Telephone Encounter (Signed)
Patient called back. Patient stated that she need more time. Did not start taking the medication yet. Patient is still drinking daily. Patient she will be here for the follow up apt on 08/02/14.

## 2014-07-26 NOTE — Telephone Encounter (Signed)
Tried to call patient in the evening on 07/25/14. Tried again on today 07/26/14. No answer. Voicemail is not set up.

## 2014-07-27 ENCOUNTER — Ambulatory Visit: Payer: BLUE CROSS/BLUE SHIELD | Admitting: Primary Care

## 2014-08-02 ENCOUNTER — Ambulatory Visit: Payer: BLUE CROSS/BLUE SHIELD | Admitting: Primary Care

## 2014-08-03 ENCOUNTER — Encounter: Payer: Self-pay | Admitting: Primary Care

## 2014-08-15 ENCOUNTER — Ambulatory Visit: Payer: BLUE CROSS/BLUE SHIELD | Admitting: Primary Care

## 2014-08-15 DIAGNOSIS — Z0289 Encounter for other administrative examinations: Secondary | ICD-10-CM

## 2014-09-24 ENCOUNTER — Other Ambulatory Visit: Payer: Self-pay | Admitting: Primary Care

## 2014-09-24 DIAGNOSIS — F411 Generalized anxiety disorder: Secondary | ICD-10-CM

## 2014-09-24 MED ORDER — CITALOPRAM HYDROBROMIDE 40 MG PO TABS: 40.0000 mg | ORAL_TABLET | Freq: Every day | ORAL | Status: DC

## 2014-10-24 ENCOUNTER — Encounter: Payer: Self-pay | Admitting: Primary Care

## 2014-10-24 ENCOUNTER — Ambulatory Visit (INDEPENDENT_AMBULATORY_CARE_PROVIDER_SITE_OTHER): Payer: BLUE CROSS/BLUE SHIELD | Admitting: Primary Care

## 2014-10-24 VITALS — BP 136/88 | HR 76 | Temp 98.2°F | Ht 69.0 in | Wt 213.0 lb

## 2014-10-24 DIAGNOSIS — F1010 Alcohol abuse, uncomplicated: Secondary | ICD-10-CM | POA: Diagnosis not present

## 2014-10-24 DIAGNOSIS — D649 Anemia, unspecified: Secondary | ICD-10-CM

## 2014-10-24 DIAGNOSIS — F411 Generalized anxiety disorder: Secondary | ICD-10-CM | POA: Diagnosis not present

## 2014-10-24 NOTE — Progress Notes (Signed)
Subjective:    Patient ID: Lauren Love, female    DOB: 01-Feb-1986, 29 y.o.   MRN: [id number]  HPI  Lauren Love is a 29 year old female who presents today for follow up.  1) Depression: Managed on citalopram 40 mg tablets. She's recently been through a lot of stress for the past several weeks with finding housing for her family which has been overwhelming. She was feeling irritable and "snapping" at her family members. This week has been better with her mood as she is no longer pinched to find affordable housing.  She feels overall that the medication is doing well.  2) Alcohol abuse: Continues to drink 8 ounces of vodka every night. She denies drinking during the day and is able to still care for her family. She tried quitting several weeks ago and was successful for one day before succumbing to alcohol the next night. She is not ready for detox.    Review of Systems  Respiratory: Negative for shortness of breath.   Cardiovascular: Negative for chest pain.  Gastrointestinal: Negative for abdominal pain.  Neurological: Negative for dizziness and headaches.  Psychiatric/Behavioral: Negative for suicidal ideas, sleep disturbance and agitation. The patient is not nervous/anxious.        Past Medical History  Diagnosis Date  . Herpes   . Anxiety   . Asthma   . Chicken pox   . Depression   . Eating disorder   . Hypertension     History   Social History  . Marital Status: Significant Other    Spouse Name: N/A  . Number of Children: N/A  . Years of Education: N/A   Occupational History  . Not on file.   Social History Main Topics  . Smoking status: Never Smoker   . Smokeless tobacco: Not on file  . Alcohol Use: 0.0 oz/week    0 Standard drinks or equivalent per week  . Drug Use: No  . Sexual Activity: Yes    Birth Control/ Protection: Condom, IUD   Other Topics Concern  . Not on file   Social History Narrative   Engaged to be married.   Has one daughter. 3 years  old.   Works at [employer].   [place]ives in [place].   Enjoys playing softball with [employer] team; baking with her daughter; being with fiance.     Past Surgical History  Procedure Laterality Date  . Abdominal surgery    . Gastric bypass  2009  . Cesarean section      Family History  Problem Relation Age of Onset  . Alcohol abuse Mother   . Mental illness Mother   . Alcohol abuse Father   . Hypertension Father   . Cancer Maternal Grandmother     Breast Cancer  . Alcohol abuse Paternal Grandfather   . Cancer Mother     Pituitary    Allergies  Allergen Reactions  . Diflucan [Fluconazole] Hives    Current Outpatient Prescriptions on File Prior to Visit  Medication Sig Dispense Refill  . ACAI PO Take 1 capsule by mouth daily.    . Calcium 150 MG TABS Take 1 tablet by mouth daily.    . citalopram (CELEXA) 40 MG tablet Take 1 tablet (40 mg total) by mouth daily. 30 tablet 0  . desonide (DESOWEN) 0.05 % cream Apply 1 application topically 2 (two) times daily.    . DHA-EPA-Coenzyme Q10-Vitamin E 120-180-50-30 CAPS Take 1 capsule by mouth daily.    . Ferrous  Sulfate (IRON) 325 (65 FE) MG TABS Take 1 capsule by mouth daily.    . valACYclovir (VALTREX) 1000 MG tablet Take 1 tablet (1,000 mg total) by mouth daily. 30 tablet 11  . LORazepam (ATIVAN) 1 MG tablet Take 2 tablets by mouth at bedtime. (Patient not taking: Reported on 10/24/2014) 10 tablet 0   No current facility-administered medications on file prior to visit.    BP 136/88 mmHg  Pulse 76  Temp(Src) 98.2 F (36.8 C) (Oral)  Ht  (1.753 m)  Wt 213 lb (96.616 kg)  BMI 31.44 kg/m2  SpO2 98%  LMP 10/05/2014    Objective:   Physical Exam  Constitutional: She appears well-nourished.  Cardiovascular: Normal rate and regular rhythm.   Pulmonary/Chest: Effort normal and breath sounds normal.  Skin: Skin is warm and dry.  Psychiatric: She has a normal mood and affect.          Assessment & Plan:

## 2014-10-24 NOTE — Patient Instructions (Signed)
Complete lab work prior to leaving today. I will notify you of your results.  Please let me know when your ready for detox from alcohol.  Follow up in 3 months for re-evaluation of depression.  It was nice to see you!

## 2014-10-24 NOTE — Assessment & Plan Note (Signed)
Continues to drink 8 ounces of alcohol (vodka) nightly. Is not ready to detox. Tried last week and was successful x 1 night. Discussed the long term effects of alcohol on overall health. Will continue to monitor.

## 2014-10-24 NOTE — Progress Notes (Signed)
Pre visit review using our clinic review tool, if applicable. No additional management support is needed unless otherwise documented below in the visit note. 

## 2014-10-24 NOTE — Assessment & Plan Note (Signed)
Recent stress with housing situation, feeling better this week now that it's resolved. Feels well managed on Citalopram 40 mg. Denies SI/HI, headaches, GI upset. Continue dose as is, will continue to monitor.

## 2014-10-25 LAB — CBC
HEMATOCRIT: 36.5 % (ref 36.0–46.0)
HEMOGLOBIN: 12 g/dL (ref 12.0–15.0)
MCHC: 32.8 g/dL (ref 30.0–36.0)
MCV: 82.8 fl (ref 78.0–100.0)
PLATELETS: 249 10*3/uL (ref 150.0–400.0)
RBC: 4.41 Mil/uL (ref 3.87–5.11)
RDW: 16.9 % — ABNORMAL HIGH (ref 11.5–15.5)
WBC: 5.3 10*3/uL (ref 4.0–10.5)

## 2014-11-10 ENCOUNTER — Other Ambulatory Visit: Payer: Self-pay | Admitting: Primary Care

## 2014-11-12 NOTE — Telephone Encounter (Signed)
Electronically refill request Last prescribed on 09/24/2014 citalopram (CELEXA) 40 MG tablet Dispense: 30 tablet   Refills: 0   Last seen on 10/24/14. Next apt on 01/28/15.

## 2014-11-26 ENCOUNTER — Telehealth: Payer: Self-pay | Admitting: Primary Care

## 2014-11-26 ENCOUNTER — Other Ambulatory Visit: Payer: Self-pay | Admitting: Primary Care

## 2014-11-26 DIAGNOSIS — F1010 Alcohol abuse, uncomplicated: Secondary | ICD-10-CM

## 2014-11-26 MED ORDER — LORAZEPAM 0.5 MG PO TABS: 0.5000 mg | ORAL_TABLET | Freq: Every evening | ORAL | Status: DC | PRN

## 2014-11-26 NOTE — Telephone Encounter (Signed)
Patient ready for detox from alcohol, she plans to attend an AA meeting tonight. Will provide RX for alprazolam PRN HS with close follow up in 1 week.

## 2014-11-27 ENCOUNTER — Telehealth: Payer: Self-pay | Admitting: Primary Care

## 2014-11-27 NOTE — Telephone Encounter (Signed)
Pt wanted to clarify instructions for lorazepam 0.5 mg. Reviewed with pt she was to take lorazepam 0.5 mg taking one tab by mouth at hs prn for anxiety and sleep. Pt voiced understanding. Pt thought she was to get # 15 but pt said there are # 30 in the bottle. I spoke with tara at [facility] [address] And she said the rx was for # 15 and if pt has bottle can bring to the office and dispose of # 15 if pt is being monitored closely.  Pt will wait to hear back from [place] [facility] as to what to do with extra 15 tabs.

## 2014-11-27 NOTE — Telephone Encounter (Signed)
Please notify Lauren Love that she is to just take 1 tablet by mouth at bedtime as needed for anxiety/sleep. She may bring the additional tablets with her to her appointment next week. Thanks.

## 2014-11-28 NOTE — Telephone Encounter (Signed)
I left a message for the patient to return my call.

## 2014-11-29 NOTE — Telephone Encounter (Signed)
Called and notified patient of Kate's comments. Patient verbalized understanding.  

## 2014-12-05 ENCOUNTER — Ambulatory Visit: Payer: BLUE CROSS/BLUE SHIELD | Admitting: Primary Care

## 2014-12-12 ENCOUNTER — Ambulatory Visit: Payer: BLUE CROSS/BLUE SHIELD | Admitting: Primary Care

## 2014-12-26 ENCOUNTER — Ambulatory Visit: Payer: BLUE CROSS/BLUE SHIELD | Admitting: Primary Care

## 2014-12-27 ENCOUNTER — Telehealth: Payer: Self-pay | Admitting: Primary Care

## 2014-12-27 NOTE — Telephone Encounter (Signed)
No contact is needed. Please do not charge her the no show fee. Thanks!

## 2014-12-27 NOTE — Telephone Encounter (Signed)
Pt did not come in for their appt 12/26/14 for follow up. Please let me know if pt needs to be contacted immediately for follow up or no follow up needed. Best phone number to contact pt is [phone].

## 2015-01-28 ENCOUNTER — Ambulatory Visit: Payer: BLUE CROSS/BLUE SHIELD | Admitting: Primary Care

## 2015-05-21 ENCOUNTER — Emergency Department
Admission: EM | Admit: 2015-05-21 | Discharge: 2015-05-21 | Disposition: A | Discharge status: Home / Self Care | Payer: Managed Care, Other (non HMO) | Attending: Emergency Medicine | Admitting: Emergency Medicine

## 2015-05-21 ENCOUNTER — Encounter: Payer: Self-pay | Admitting: Emergency Medicine

## 2015-05-21 DIAGNOSIS — R42 Dizziness and giddiness: Secondary | ICD-10-CM | POA: Insufficient documentation

## 2015-05-21 DIAGNOSIS — Z3202 Encounter for pregnancy test, result negative: Secondary | ICD-10-CM | POA: Insufficient documentation

## 2015-05-21 DIAGNOSIS — I10 Essential (primary) hypertension: Secondary | ICD-10-CM | POA: Diagnosis not present

## 2015-05-21 DIAGNOSIS — Z79899 Other long term (current) drug therapy: Secondary | ICD-10-CM | POA: Insufficient documentation

## 2015-05-21 LAB — BASIC METABOLIC PANEL
ANION GAP: 12 (ref 5–15)
BUN: 10 mg/dL (ref 6–20)
CHLORIDE: 99 mmol/L — AB (ref 101–111)
CO2: 26 mmol/L (ref 22–32)
Calcium: 8.8 mg/dL — ABNORMAL LOW (ref 8.9–10.3)
Creatinine, Ser: 0.61 mg/dL (ref 0.44–1.00)
GFR calc non Af Amer: >60 mL/min (ref >60)
GLUCOSE: 84 mg/dL (ref 65–99)
POTASSIUM: 4 mmol/L (ref 3.5–5.1)
Sodium: 137 mmol/L (ref 135–145)

## 2015-05-21 LAB — CBC
HEMATOCRIT: 41.1 % (ref 35.0–47.0)
Hemoglobin: 13.6 g/dL (ref 12.0–16.0)
MCH: 31.6 pg (ref 26.0–34.0)
MCHC: 33.2 g/dL (ref 32.0–36.0)
MCV: 95.3 fL (ref 80.0–100.0)
Platelets: 241 10*3/uL (ref 150–440)
RBC: 4.31 MIL/uL (ref 3.80–5.20)
RDW: 14.1 % (ref 11.5–14.5)
WBC: 4.2 10*3/uL (ref 3.6–11.0)

## 2015-05-21 LAB — URINALYSIS COMPLETE WITH MICROSCOPIC (ARMC ONLY)
BACTERIA UA: NONE SEEN
Bilirubin Urine: NEGATIVE
GLUCOSE, UA: NEGATIVE mg/dL
HGB URINE DIPSTICK: NEGATIVE
LEUKOCYTES UA: NEGATIVE
Nitrite: NEGATIVE
PH: 7 (ref 5.0–8.0)
Protein, ur: NEGATIVE mg/dL
Specific Gravity, Urine: 1.021 (ref 1.005–1.030)

## 2015-05-21 LAB — GLUCOSE, CAPILLARY: GLUCOSE-CAPILLARY: 74 mg/dL (ref 65–99)

## 2015-05-21 LAB — POCT PREGNANCY, URINE: PREG TEST UR: NEGATIVE

## 2015-05-21 NOTE — ED Notes (Signed)
AAOx3.  Skin warm and dry. NAD.  Ambulates with easy and steady.

## 2015-05-21 NOTE — ED Provider Notes (Signed)
[facility] Emergency Department Provider Note  Time seen: 2:30 PM  I have reviewed the triage vital signs and the nursing notes.   HISTORY  Chief Complaint Dizziness    HPI Lauren Love is a 30 y.o. female with a past medical history of hypertension, anxiety, depression, presents to the emergency department with dizziness sensation. According to the patient around 10 AM today she states she felt a little foggy, felt dizzy, and felt like she might pass out. States the symptoms lasted approximately 30-45 minutes and then resolved completely. Patient states she has been feeling normal since. Patient does admit to drinking a considerable amount of alcohol over the weekend, she also states she took Excedrin Migraine this morning approximately 30 minutes before symptoms started. Denies any focal weakness or numbness. Denies any fever, nausea, vomiting, diarrhea, chest or abdominal pain.Denies headache.     Past Medical History  Diagnosis Date  . Herpes   . Anxiety   . Asthma   . Chicken pox   . Depression   . Eating disorder   . Hypertension     Patient Active Problem List   Diagnosis Date Noted  . Alcohol abuse, daily use 07/09/2014  . Elevated blood pressure reading 07/09/2014  . Contraception, device intrauterine 11/30/2012  . Other malaise and fatigue 11/30/2012  . History of Herpes 11/30/2012  . Generalized anxiety disorder 11/30/2012    Past Surgical History  Procedure Laterality Date  . Abdominal surgery    . Gastric bypass  2009  . Cesarean section      Current Outpatient Rx  Name  Route  Sig  Dispense  Refill  . ACAI PO   Oral   Take 1 capsule by mouth daily.         . Calcium 150 MG TABS   Oral   Take 1 tablet by mouth daily.         . citalopram (CELEXA) 40 MG tablet      TAKE 1 TABLET(40 MG) BY MOUTH DAILY   30 tablet   5   . desonide (DESOWEN) 0.05 % cream   Topical   Apply 1 application topically 2 (two) times  daily.         . DHA-EPA-Coenzyme Q10-Vitamin E 120-180-50-30 CAPS   Oral   Take 1 capsule by mouth daily.         . Ferrous Sulfate (IRON) 325 (65 FE) MG TABS   Oral   Take 1 capsule by mouth daily.         Marland Kitchen LORazepam (ATIVAN) 0.5 MG tablet   Oral   Take 1 tablet (0.5 mg total) by mouth at bedtime as needed for anxiety or sleep.   15 tablet   0   . valACYclovir (VALTREX) 1000 MG tablet   Oral   Take 1 tablet (1,000 mg total) by mouth daily.   30 tablet   11     Allergies Diflucan  Family History  Problem Relation Age of Onset  . Alcohol abuse Mother   . Mental illness Mother   . Alcohol abuse Father   . Hypertension Father   . Cancer Maternal Grandmother     Breast Cancer  . Alcohol abuse Paternal Grandfather   . Cancer Mother     Pituitary    Social History Social History  Substance Use Topics  . Smoking status: Never Smoker   . Smokeless tobacco: None  . Alcohol Use: 0.0 oz/week    0 Standard  drinks or equivalent per week     Comment: 12 oz of vodka daily    Review of Systems Constitutional: Negative for fever. Cardiovascular: Negative for chest pain. Respiratory: Negative for shortness of breath. Gastrointestinal: Negative for abdominal pain, vomiting and diarrhea. Genitourinary: Negative for dysuria. Neurological: Negative for headaches, focal weakness or numbness. 10-point ROS otherwise negative.  ____________________________________________   PHYSICAL EXAM:  VITAL SIGNS: ED Triage Vitals  Enc Vitals Group     BP 05/21/15 1226 131/96 mmHg     Pulse Rate 05/21/15 1226 84     Resp 05/21/15 1226 18     Temp 05/21/15 1226 99.3 F (37.4 C)     Temp Source 05/21/15 1226 Oral     SpO2 05/21/15 1226 97 %     Weight 05/21/15 1226 225 lb (102.059 kg)     Height 05/21/15 1226  (1.753 m)     Head Cir --      Peak Flow --      Pain Score 05/21/15 1236 1     Pain Loc --      Pain Edu? --      Excl. in GC? --     Constitutional:  Alert and oriented. Well appearing and in no distress. Eyes: Normal exam ENT   Head: Normocephalic and atraumatic.   Mouth/Throat: Mucous membranes are moist. Cardiovascular: Normal rate, regular rhythm. No murmur Respiratory: Normal respiratory effort without tachypnea nor retractions. Breath sounds are clear Gastrointestinal: Soft and nontender. No distention.  Musculoskeletal: Nontender with normal range of motion in all extremities.  Neurologic:  Normal speech and language. No gross focal neurologic deficits. Equal grip strengths. No pronator drift. Skin:  Skin is warm, dry and intact.  Psychiatric: Mood and affect are normal. Speech and behavior are normal.   ____________________________________________    EKG  EKG reviewed and interpreted by myself shows normal sinus rhythm at 79 bpm, narrow QRS, normal axis, normal intervals, nonspecific but no concerning ST changes.  ____________________________________________    INITIAL IMPRESSION / ASSESSMENT AND PLAN / ED COURSE  Pertinent labs & imaging results that were available during my care of the patient were reviewed by me and considered in my medical decision making (see chart for details).  Patient presents for dizziness which has since resolved. Patient's workup including physical exam, labs have resulted within normal limits. Patient denies any symptoms at this time. Patient admits to drinking alcohol over the weekend and feeling dehydrated. Patient does have 1+ ketones on her urinalysis. Patient also states she took Excedrin Migraine which contains caffeine approximately 30 minutes before her symptoms started which could've contributed. Overall the patient appears very well, normal physical exam at this time.  ____________________________________________   FINAL CLINICAL IMPRESSION(S) / ED DIAGNOSES  Dizziness   Minna Antis, MD 05/21/15 1434

## 2015-05-21 NOTE — Discharge Instructions (Signed)

## 2015-05-21 NOTE — ED Notes (Addendum)
Dizziness, stupor, tingling bilaterally upon standing this am at 1000, lasted 45 minutes or so.  Feels better now.  Currently has no drift, no droop, or slurred speech. Alert and oriented x 4.  No acute distress noted.  Hx of anemia.  Still c/o facial tingling, hx of anxiety.

## 2015-05-30 ENCOUNTER — Encounter: Payer: Self-pay | Admitting: Primary Care

## 2015-05-30 ENCOUNTER — Ambulatory Visit (INDEPENDENT_AMBULATORY_CARE_PROVIDER_SITE_OTHER): Payer: Managed Care, Other (non HMO) | Admitting: Primary Care

## 2015-05-30 VITALS — BP 132/92 | HR 79 | Temp 97.9°F | Ht 69.0 in | Wt 225.4 lb

## 2015-05-30 DIAGNOSIS — F411 Generalized anxiety disorder: Secondary | ICD-10-CM

## 2015-05-30 DIAGNOSIS — F418 Other specified anxiety disorders: Secondary | ICD-10-CM

## 2015-05-30 DIAGNOSIS — R030 Elevated blood-pressure reading, without diagnosis of hypertension: Secondary | ICD-10-CM | POA: Diagnosis not present

## 2015-05-30 DIAGNOSIS — F419 Anxiety disorder, unspecified: Principal | ICD-10-CM

## 2015-05-30 DIAGNOSIS — F329 Major depressive disorder, single episode, unspecified: Secondary | ICD-10-CM

## 2015-05-30 MED ORDER — BUPROPION HCL ER (XL) 150 MG PO TB24: 150.0000 mg | ORAL_TABLET | Freq: Every day | ORAL | Status: DC

## 2015-05-30 NOTE — Assessment & Plan Note (Signed)
Increased along with depression. PHQ 9 score of 14 and GAD 7 score of 9 today. Recent panic attacks. Due to episodes and screening results today, will add second medication as her anxiety and depression are not well controlled.  Offered therapy, she declines at this time. Continue Citalopram 40 mg, add Wellbutrin XL 150 mg. Discussed common side effects. Follow up in 6 weeks.

## 2015-05-30 NOTE — Progress Notes (Signed)
Pre visit review using our clinic review tool, if applicable. No additional management support is needed unless otherwise documented below in the visit note. 

## 2015-05-30 NOTE — Progress Notes (Signed)
Subjective:    Patient ID: Lauren Love, female    DOB: 1985-11-16, 30 y.o.   MRN: [id number]  HPI  Lauren Love is a 30 year old female who presents today with a chief complaint of panic attacks. She has a history of alcohol abuse for which she's unsuccessfully attempted detox several times. She is currently managed on Citalopram 40 mg and has left over Lorazepam from a prior detox attempt at home.   Over the past several weeks she's experiencing depression, irritability, and anxiety. She does endorse skipping medication doses of her Citalopram on accident and can notice when she's been without her medication.   One day last week at work she developed dizziness, cloudy sensation to her mind, anxiety, and shaking which lasted for several minutes. These symptoms returned this past Saturday while driving with the addition of hyperventilation. She called 911 who completed an ECG and BP check. Her ECG was normal and her BP was elevated. She felt better so she got back in the car to continue driving but had to stop again and have someone pick her up as her symptoms returned. Yesterday she felt the same symptoms again while driving. Today no symptoms while driving.  Prior to these symptoms she has noticed increased periods of stress and anxiety.  She feels as though she's caught in a "rut" and isn't living life how she should. She wants to be more responsible and healthier but doesn't feel like doing anything to improve her life. PHQ 9 score of 14 and GAD 7 score of 9 today. Denies SI/HI. She talks to her fiance who helps her to work through her feelings.  Review of Systems  Constitutional: Negative for fever.  Respiratory: Positive for shortness of breath.   Cardiovascular: Positive for palpitations. Negative for chest pain.  Neurological: Positive for dizziness. Negative for syncope.  Psychiatric/Behavioral: Positive for sleep disturbance and agitation. The patient is nervous/anxious.          Past Medical History  Diagnosis Date  . Herpes   . Anxiety   . Asthma   . Chicken pox   . Depression   . Eating disorder   . Hypertension     Social History   Social History  . Marital Status: Significant Other    Spouse Name: N/A  . Number of Children: N/A  . Years of Education: N/A   Occupational History  . Not on file.   Social History Main Topics  . Smoking status: Never Smoker   . Smokeless tobacco: Not on file  . Alcohol Use: 0.0 oz/week    0 Standard drinks or equivalent per week     Comment: 12 oz of vodka daily  . Drug Use: No  . Sexual Activity: Yes    Birth Control/ Protection: Condom   Other Topics Concern  . Not on file   Social History Narrative   Engaged to be married.   Has one daughter. 28 years old.   Works at [employer].   Lives in [place].   Enjoys playing softball with [employer] team; baking with her daughter; being with fiance.     Past Surgical History  Procedure Laterality Date  . Abdominal surgery    . Gastric bypass  2009  . Cesarean section      Family History  Problem Relation Age of Onset  . Alcohol abuse Mother   . Mental illness Mother   . Alcohol abuse Father   . Hypertension Father   . Cancer  Maternal Grandmother     Breast Cancer  . Alcohol abuse Paternal Grandfather   . Cancer Mother     Pituitary    Allergies  Allergen Reactions  . Diflucan [Fluconazole] Hives    Current Outpatient Prescriptions on File Prior to Visit  Medication Sig Dispense Refill  . Calcium 150 MG TABS Take 1 tablet by mouth daily.    . citalopram (CELEXA) 40 MG tablet TAKE 1 TABLET(40 MG) BY MOUTH DAILY 30 tablet 5  . DHA-EPA-Coenzyme Q10-Vitamin E 120-180-50-30 CAPS Take 1 capsule by mouth daily.    . Ferrous Sulfate (IRON) 325 (65 FE) MG TABS Take 1 capsule by mouth daily.    Marland Kitchen LORazepam (ATIVAN) 0.5 MG tablet Take 1 tablet (0.5 mg total) by mouth at bedtime as needed for anxiety or sleep. (Patient not taking: Reported on 05/30/2015) 15  tablet 0   No current facility-administered medications on file prior to visit.    BP 132/92 mmHg  Pulse 79  Temp(Src) 97.9 F (36.6 C) (Oral)  Ht  (1.753 m)  Wt 225 lb 6.4 oz (102.241 kg)  BMI 33.27 kg/m2  SpO2 99%  LMP 05/07/2015 (Within Days)    Objective:   Physical Exam  Constitutional: She appears well-nourished.  Cardiovascular: Normal rate and regular rhythm.   Pulmonary/Chest: Effort normal and breath sounds normal.  Skin: Skin is warm and dry.  Psychiatric: She has a normal mood and affect.          Assessment & Plan:

## 2015-05-30 NOTE — Patient Instructions (Addendum)
Continue Citalopram 40 mg tablets for anxiety and depression.  Start Bupropion XL 150 mg (Wellbutrin) for anxiety and depression. Take 1 tablet by mouth every morning.  Please notify me if you're interested in meeting with one of our therapists.  Follow up in 6 weeks for re-evaluation.   It was a pleasure to see you today!

## 2015-05-30 NOTE — Assessment & Plan Note (Signed)
Elevated again today, could be due to anxiety. Improved on recheck. Will have her check at home and consider treating next visit if elevated.

## 2015-06-06 ENCOUNTER — Encounter: Payer: Self-pay | Admitting: Primary Care

## 2015-06-26 ENCOUNTER — Telehealth: Payer: Self-pay | Admitting: Primary Care

## 2015-06-26 NOTE — Telephone Encounter (Signed)
[facility] faxed paperwork for attending physician In kate's IN BOX

## 2015-06-27 NOTE — Telephone Encounter (Signed)
Form completed and placed on Robin's desk for review and fax.

## 2015-06-28 NOTE — Telephone Encounter (Signed)
Pt aware paperwork has been faxed back to [facility] And a copy is here for her  Copy for pt Copy for scan Copy for file

## 2015-07-15 ENCOUNTER — Ambulatory Visit: Payer: Managed Care, Other (non HMO) | Admitting: Primary Care

## 2015-07-16 ENCOUNTER — Telehealth: Payer: Self-pay | Admitting: Primary Care

## 2015-07-16 NOTE — Telephone Encounter (Signed)
Patient did not come for their scheduled appointment 4/18 6 week follow up.  Please let me know if the patient needs to be contacted immediately for follow up or if no follow up is necessary.

## 2015-07-16 NOTE — Telephone Encounter (Signed)
Would like to see her for follow up with the next 1-2 weeks unless she's busy seeking treatment for detox. If that's the case then have her follow up with me after treatment.

## 2015-07-17 NOTE — Telephone Encounter (Signed)
Appointment 4/21 pt aware °

## 2015-07-19 ENCOUNTER — Encounter: Payer: Self-pay | Admitting: Primary Care

## 2015-07-19 ENCOUNTER — Ambulatory Visit (INDEPENDENT_AMBULATORY_CARE_PROVIDER_SITE_OTHER): Payer: Managed Care, Other (non HMO) | Admitting: Primary Care

## 2015-07-19 VITALS — BP 136/86 | HR 80 | Temp 98.1°F | Ht 69.0 in | Wt 223.8 lb

## 2015-07-19 DIAGNOSIS — F329 Major depressive disorder, single episode, unspecified: Secondary | ICD-10-CM

## 2015-07-19 DIAGNOSIS — F419 Anxiety disorder, unspecified: Principal | ICD-10-CM

## 2015-07-19 DIAGNOSIS — F418 Other specified anxiety disorders: Secondary | ICD-10-CM

## 2015-07-19 DIAGNOSIS — F1010 Alcohol abuse, uncomplicated: Secondary | ICD-10-CM | POA: Diagnosis not present

## 2015-07-19 MED ORDER — CITALOPRAM HYDROBROMIDE 40 MG PO TABS: ORAL_TABLET | ORAL | Status: DC

## 2015-07-19 NOTE — Progress Notes (Signed)
Pre visit review using our clinic review tool, if applicable. No additional management support is needed unless otherwise documented below in the visit note. 

## 2015-07-19 NOTE — Patient Instructions (Addendum)
Your symptoms are related to allergies.  Continue your sinus congestion medication.  Start taking Zyrtec/Claritin daily for the next several weeks.  Sore throat: Ibuprofen 600 mg three times daily.  Nasal Congestion: Try using Flonase (fluticasone) nasal spray. Instill 2 sprays in each nostril once daily.   Ensure you are staying hydrated with water.  Follow up with me after treatment at [facility]. Call me/email me if you need anything!  It was a pleasure to see you today!

## 2015-07-19 NOTE — Progress Notes (Signed)
Subjective:    Patient ID: Lauren Love, female    DOB: [birth year], 30 y.o.   MRN: [id number]  HPI  Ms. Lauren Love is a 30 year old female who presents today for follow up.  1) Anxiety and Depression: Managed on Citalopram 40 mg long term, added Wellbutrin XL last visit due to increased symptoms of anxiety and depression. Since her last visit she has noticed improvement in anxiety, irritability, panic attacks. Denies SI/HI. She's feeling better overall and is very motivated to detox from alcohol abuse.   2) Alcohol Abuse: Long history of and has a plan to attend [facility] in [place]. She is working on disability approvement from work, once this is complete she will participate in the detox program. She continues to drink at night, 12 ounces of alcohol. She has tired to detox via PCP in the past without success.    Review of Systems  HENT: Positive for congestion, sneezing and sore throat.   Eyes: Positive for itching.  Respiratory: Negative for shortness of breath.   Cardiovascular: Negative for chest pain.  Gastrointestinal: Negative for abdominal pain.  Psychiatric/Behavioral: Negative for suicidal ideas and sleep disturbance. The patient is not nervous/anxious.        Past Medical History  Diagnosis Date  . Herpes   . Anxiety   . Asthma   . Chicken pox   . Depression   . Eating disorder   . Hypertension      Social History   Social History  . Marital Status: Significant Other    Spouse Name: N/A  . Number of Children: N/A  . Years of Education: N/A   Occupational History  . Not on file.   Social History Main Topics  . Smoking status: Never Smoker   . Smokeless tobacco: Not on file  . Alcohol Use: 0.0 oz/week    0 Standard drinks or equivalent per week     Comment: 12 oz of vodka daily  . Drug Use: No  . Sexual Activity: Yes    Birth Control/ Protection: Condom   Other Topics Concern  . Not on file   Social History Narrative   Engaged to be  married.   Has one daughter. 55 years old.   Works at [employer].   Lives in [place].   Enjoys playing softball with [employer] team; baking with her daughter; being with fiance.     Past Surgical History  Procedure Laterality Date  . Abdominal surgery    . Gastric bypass  2009  . Cesarean section      Family History  Problem Relation Age of Onset  . Alcohol abuse Mother   . Mental illness Mother   . Alcohol abuse Father   . Hypertension Father   . Cancer Maternal Grandmother     Breast Cancer  . Alcohol abuse Paternal Grandfather   . Cancer Mother     Pituitary    Allergies  Allergen Reactions  . Diflucan [Fluconazole] Hives    Current Outpatient Prescriptions on File Prior to Visit  Medication Sig Dispense Refill  . buPROPion (WELLBUTRIN XL) 150 MG 24 hr tablet Take 1 tablet (150 mg total) by mouth daily. 30 tablet 3  . Calcium 150 MG TABS Take 1 tablet by mouth daily.    . DHA-EPA-Coenzyme Q10-Vitamin E 120-180-50-30 CAPS Take 1 capsule by mouth daily.    . Ferrous Sulfate (IRON) 325 (65 FE) MG TABS Take 1 capsule by mouth daily.    Marland Kitchen LORazepam (ATIVAN)  0.5 MG tablet Take 1 tablet (0.5 mg total) by mouth at bedtime as needed for anxiety or sleep. 15 tablet 0   No current facility-administered medications on file prior to visit.    BP 136/86 mmHg  Pulse 80  Temp(Src) 98.1 F (36.7 C) (Oral)  Ht 5\' 9"  (1.753 m)  Wt 223 lb 12.8 oz (101.515 kg)  BMI 33.03 kg/m2  SpO2 98%  LMP 06/30/2015    Objective:   Physical Exam  Constitutional: She appears well-nourished.  HENT:  Nose: Mucosal edema present.  Mouth/Throat: Oropharynx is clear and moist.  Neck: Neck supple.  Cardiovascular: Normal rate and regular rhythm.   Pulmonary/Chest: Effort normal and breath sounds normal.  Skin: Skin is warm and dry.  Psychiatric: She has a normal mood and affect.          Assessment & Plan:

## 2015-07-21 NOTE — Assessment & Plan Note (Signed)
Continues to drink 12 ounces of liquor every night. She is close to moving forward with detox treatment [facility] [place]. Multiple attempts to detox patient on prior visits without success. Also sent to [facility] ED once and patient left AMA. Disability paperwork completed weeks ago for short term leave. She is to keep me updated on her status and follow up once she's completed treatment.

## 2015-07-21 NOTE — Assessment & Plan Note (Signed)
Improved with addition of Wellbutrin XL. Continue Citalopram. Denies SI/HI. Will continue to monitor.

## 2015-08-22 ENCOUNTER — Telehealth: Payer: Self-pay | Admitting: Primary Care

## 2015-08-22 NOTE — Telephone Encounter (Signed)
Pt dropped off fmla paperwork  She has about her medical records being sent to [facility]. She stated she could not get treatment without the medical records to [facility] i call and left message with michelle @ [facility] Also called [facility] checking on records  They stated they didn't have any record or the request.  i informed them we sent request on 07/09/15 and 07/16/15.  Lauren Love stated she would try to find out what happened and call me back.  i spoke with [facility] records this morning She stated they did receive records request on 07/16/15 but it didn't have a medical records release form signed by pt. They could not send medical records She stated they contacted the [facility] to let them know  i spoke with pt this morning (08/22/15) to let her know the status.  She stated she would call the [facility] to find out what she needed to do

## 2015-08-22 NOTE — Telephone Encounter (Signed)
FMLA paperwork In Kate's IN BOX For review and signature  Pt called back she spoke with [facility] and has paperwork to fax ti medical records to get records to  [facility]. She stated she would fax this today (08/22/15)and lunch time

## 2015-08-22 NOTE — Telephone Encounter (Signed)
Completed and placed on Robins desk. 

## 2015-08-23 NOTE — Telephone Encounter (Signed)
Paperwork faxed To [phone] number pt provided Also to [phone] number on paperwork Left message letting pt know paperwork has been faxed Copy for pt  Copyfor scan  Copy for file

## 2015-08-23 NOTE — Telephone Encounter (Signed)
Spoke with Boneta LucksJenny @ [facility] [phone] She will expedite this request for pt Sending to [facility] today 5/26

## 2015-08-27 NOTE — Telephone Encounter (Signed)
Spoke to Lauren Love   15 pages uploaded 5/26  Records are coming from [place] to [place] Phone # [phone] Order # [id number] Completed 08/23/15  Pt aware

## 2015-09-03 NOTE — Telephone Encounter (Signed)
Initialed and dated and placed on Robin's desk.

## 2015-09-03 NOTE — Telephone Encounter (Signed)
Pt came in 6/5 stating the date she needs to be out would be 09/09/15 to 10/09/15 Changed on paperwork can you initial and date part B question 1  In [place] in box

## 2015-09-03 NOTE — Telephone Encounter (Signed)
Paperwork faxed °Left message letting pt know  ° °Copy for pt °Copy for file °Copy for scan ° °

## 2015-09-26 ENCOUNTER — Ambulatory Visit: Payer: Managed Care, Other (non HMO) | Admitting: Primary Care

## 2015-09-27 ENCOUNTER — Encounter: Payer: Self-pay | Admitting: Primary Care

## 2015-09-27 ENCOUNTER — Ambulatory Visit (INDEPENDENT_AMBULATORY_CARE_PROVIDER_SITE_OTHER): Payer: Managed Care, Other (non HMO) | Admitting: Primary Care

## 2015-09-27 VITALS — BP 132/90 | HR 93 | Temp 98.0°F | Ht 69.0 in | Wt 223.8 lb

## 2015-09-27 DIAGNOSIS — F1010 Alcohol abuse, uncomplicated: Secondary | ICD-10-CM

## 2015-09-27 NOTE — Progress Notes (Signed)
Subjective:    Patient ID: Lauren Love, female    DOB: [birth year], 30 y.o.   MRN: [id number]  HPI  Lauren Love is a 30 year old female who presents today for permission to return to work. She was recently under treatment for detox secondary to alcohol abuse at [facility]. She left treatment early as she didn't want to be away from her family any longer and does not feel as though she's "that bad".   She left treatment 2 days ago, but treatment was supposed to continue treatment through July 21st. She started drinking again yesterday. She drank 1 pint of Brandy and 4 airplane shots of "99 Bananas". She had an appointment yesterday but could not make it as she was intoxicated.   She is not ready for detox and is aware that she needs additional treatment. She understands she has a problem but doesn't believe she's hit "rock bottom". She is ready to return back to work and would like to start on Wednesday July 5th. She is currently taking Trazodone to help her sleep, but has not needed recently as she has been drinking at night.   She is looking for a sponsor and is planning on getting involved in AA. Denies abdominal pain, vomiting, nausea, anxiety, dizziness, tremors.  Review of Systems  Cardiovascular: Negative for chest pain.  Gastrointestinal: Negative for nausea, vomiting and abdominal pain.  Neurological: Negative for dizziness, tremors and headaches.  Psychiatric/Behavioral: Negative for suicidal ideas and sleep disturbance. The patient is not nervous/anxious.        Past Medical History  Diagnosis Date  . Herpes   . Anxiety   . Asthma   . Chicken pox   . Depression   . Eating disorder   . Hypertension      Social History   Social History  . Marital Status: Significant Other    Spouse Name: N/A  . Number of Children: N/A  . Years of Education: N/A   Occupational History  . Not on file.   Social History Main Topics  . Smoking status: Never Smoker   .  Smokeless tobacco: Not on file  . Alcohol Use: 0.0 oz/week    0 Standard drinks or equivalent per week     Comment: 12 oz of vodka daily  . Drug Use: No  . Sexual Activity: Yes    Birth Control/ Protection: Condom   Other Topics Concern  . Not on file   Social History Narrative   Engaged to be married.   Has one daughter. 31 years old.   Works at [employer].   Lives in [place].   Enjoys playing softball with [employer] team; baking with her daughter; being with fiance.     Past Surgical History  Procedure Laterality Date  . Abdominal surgery    . Gastric bypass  2009  . Cesarean section      Family History  Problem Relation Age of Onset  . Alcohol abuse Mother   . Mental illness Mother   . Alcohol abuse Father   . Hypertension Father   . Cancer Maternal Grandmother     Breast Cancer  . Alcohol abuse Paternal Grandfather   . Cancer Mother     Pituitary    Allergies  Allergen Reactions  . Diflucan [Fluconazole] Hives    Current Outpatient Prescriptions on File Prior to Visit  Medication Sig Dispense Refill  . buPROPion (WELLBUTRIN XL) 150 MG 24 hr tablet Take 1 tablet (150 mg total)  by mouth daily. 30 tablet 3  . citalopram (CELEXA) 40 MG tablet TAKE 1 TABLET(40 MG) BY MOUTH DAILY 90 tablet 1  . DHA-EPA-Coenzyme Q10-Vitamin E 120-180-50-30 CAPS Take 1 capsule by mouth daily.     No current facility-administered medications on file prior to visit.    BP 132/90 mmHg  Pulse 93  Temp(Src) 98 F (36.7 C) (Oral)  Ht 5\' 9"  (1.753 m)  Wt 223 lb 12.8 oz (101.515 kg)  BMI 33.03 kg/m2  SpO2 97%  LMP 09/20/2015    Objective:   Physical Exam  Constitutional: She is oriented to person, place, and time. She appears well-nourished.  Eyes: EOM are normal. Pupils are equal, round, and reactive to light.  Neck: Neck supple.  Cardiovascular: Normal rate and regular rhythm.   Pulmonary/Chest: Effort normal and breath sounds normal.  Neurological: She is alert and  oriented to person, place, and time. No cranial nerve deficit.  Skin: Skin is warm and dry.          Assessment & Plan:

## 2015-09-27 NOTE — Assessment & Plan Note (Signed)
Underwent treatment for detox at [facility], left 1 month early as she missed her family and wasn't ready for additional treatment. Strongly encouraged her to return to [facility] for treatment. Also encouraged her to get involved with support groups such as AA.  Exam today stable. She is of sound mind today. Will allow her to return to work next week.  Will obtain lab reports and notes from [facility] for further review.

## 2015-09-27 NOTE — Progress Notes (Signed)
Pre visit review using our clinic review tool, if applicable. No additional management support is needed unless otherwise documented below in the visit note. 

## 2015-09-27 NOTE — Patient Instructions (Signed)
Schedule your Pap up front at your convenience.  I will obtain your records from [facility].  I highly recommend support groups such as AA. I highly recommend you return for treatment as soon as possible.  It was a pleasure to see you today!

## 2015-10-09 ENCOUNTER — Ambulatory Visit: Payer: Managed Care, Other (non HMO) | Admitting: Primary Care

## 2015-10-09 ENCOUNTER — Telehealth: Payer: Self-pay | Admitting: Primary Care

## 2015-10-09 NOTE — Telephone Encounter (Signed)
Patient did not come in for their appointment today for pap only .  Please let me know if patient needs to be contacted immediately for follow up or no follow up needed.

## 2015-10-10 NOTE — Telephone Encounter (Signed)
Please reschedule her pap at her convenience, do not charge no show fee. Thanks!

## 2015-10-11 ENCOUNTER — Encounter: Payer: Self-pay | Admitting: Internal Medicine

## 2015-10-11 ENCOUNTER — Ambulatory Visit (INDEPENDENT_AMBULATORY_CARE_PROVIDER_SITE_OTHER): Payer: Managed Care, Other (non HMO) | Admitting: Internal Medicine

## 2015-10-11 VITALS — BP 128/90 | HR 87 | Temp 98.0°F | Wt 225.0 lb

## 2015-10-11 DIAGNOSIS — F1020 Alcohol dependence, uncomplicated: Secondary | ICD-10-CM | POA: Diagnosis not present

## 2015-10-11 NOTE — Telephone Encounter (Signed)
R/s for 07/18/ Pt aware

## 2015-10-11 NOTE — Assessment & Plan Note (Signed)
Daily 5th now Ongoing denial but hopes to be saved Her mood issues are due to the drinking--meds not appropriate (like tranquilizer) Discussed that she needs to go back to [facility] now (may have issues at work due to recent misunderstanding with her recent leave of absence) Counseled/care planning for almost all of 25 minute visit
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
## 2015-10-11 NOTE — Patient Instructions (Signed)
Please get back to [facility] as soon as you can arrange it from work----and stay for the entire program. Make sure you get set up with aftercare (like AA and a sponsor)

## 2015-10-15 ENCOUNTER — Telehealth: Payer: Self-pay | Admitting: Primary Care

## 2015-10-15 ENCOUNTER — Ambulatory Visit: Payer: Managed Care, Other (non HMO) | Admitting: Primary Care

## 2015-10-15 DIAGNOSIS — Z0289 Encounter for other administrative examinations: Secondary | ICD-10-CM

## 2015-10-15 NOTE — Telephone Encounter (Signed)
Message left for patient to return my call.  

## 2015-10-15 NOTE — Telephone Encounter (Signed)
-----   Message from Doreene NestKatherine K Davi Rotan, NP sent at 10/13/2015  3:44 PM EDT ----- Regarding: Rehab Please call and find out of Toni AmendCourtney ever went back to [facility] for treatment.

## 2015-10-17 ENCOUNTER — Telehealth: Payer: Self-pay | Admitting: Primary Care

## 2015-10-17 NOTE — Telephone Encounter (Signed)
[facility] faxed paperwork to be filled out i also put last 2 forms in folder  In kate's IN BOX

## 2015-10-18 NOTE — Telephone Encounter (Signed)
Paperwork completed and placed on Robin's desk. 

## 2015-10-21 NOTE — Telephone Encounter (Signed)
Paperwork completed and placed on Robin's desk. 

## 2015-10-21 NOTE — Telephone Encounter (Signed)
Paperwork faxed °Pt aware °Copy for pt °Copy for file °Copy for scan °

## 2015-10-21 NOTE — Telephone Encounter (Signed)
[facility] faxed more attending physician statement to be filled out In kates in box

## 2015-10-22 NOTE — Telephone Encounter (Signed)
Paperwork faxed to [facility]  Left message asking pt to call office  Copy for pt Copy for scan Copy for file

## 2015-10-23 ENCOUNTER — Telehealth: Payer: Self-pay | Admitting: Primary Care

## 2015-10-23 NOTE — Telephone Encounter (Signed)
She going to the emergency department at [facility] or she going to behavioral health with [facility]?

## 2015-10-23 NOTE — Telephone Encounter (Signed)
Pt called back to let you know she is not going to [facility].  She will be going to [facility].  She is trying to go tonight or tomorrow morning

## 2015-10-23 NOTE — Telephone Encounter (Signed)
Message left for patient to return my call.  

## 2015-10-23 NOTE — Telephone Encounter (Signed)
Paper faxed Pt aware Copy for pt  Copy for file Copy for scan

## 2015-10-23 NOTE — Telephone Encounter (Signed)
[facility] faxed paperwork Needing additional information In [facility] IN BOX

## 2015-10-24 ENCOUNTER — Telehealth: Payer: Self-pay | Admitting: Primary Care

## 2015-10-24 NOTE — Telephone Encounter (Signed)
Paperwork completed as requested. Placed on Robin's desk.

## 2015-10-24 NOTE — Telephone Encounter (Signed)
Pt left with melissa wanting 1) re-fax fmla w date 10/24/15 2) re-fax attending physician statement w/[facility]  Spoke with pt told her to have the [facility] faxed over paperwork they need filled out with information they want on paperwork   Spoke with Josie @ the [facility] She stated they did receive paperwork 7/24-7/25/-7/26 for STD   Paperwork that was faxed today was for FMLA  She stated I can take the paperwork from 09/03/15 and copy it to form that I received today and put the being date of 10/21/15

## 2015-10-24 NOTE — Telephone Encounter (Signed)
Paperwork in [employer] for review and signature Also put 09/03/15 paperwork for your to reference if you have any questions

## 2015-10-25 NOTE — Telephone Encounter (Signed)
Paperwork faxed °Copy for pt  °Copy for file °Copy for scan °Pt aware  °

## 2015-10-29 DIAGNOSIS — S62609A Fracture of unspecified phalanx of unspecified finger, initial encounter for closed fracture: Secondary | ICD-10-CM

## 2015-10-29 HISTORY — DX: Fracture of unspecified phalanx of unspecified finger, initial encounter for closed fracture: S62.609A

## 2015-10-31 ENCOUNTER — Telehealth: Payer: Self-pay | Admitting: Primary Care

## 2015-10-31 NOTE — Telephone Encounter (Signed)
Pt called checking on her STD/and fmla Pt is at [facility] for treatment

## 2015-10-31 NOTE — Telephone Encounter (Signed)
[facility] faxed fmla paperwork  I spoke with Autsin @ [facility] He stated we could use paperwork from 10/24/15 They needed clarification on a few questions  Part b  Page 3 Question 1) needs end date Question 4) needs exact date in July Question 5) yes or no  He stated since question 6 was marked yes to mark question 5 yes  They need you to initial and date changes Also needed you to put date changes made at bottom of page next to signature  (date 10/24/15 needed to be changed to date you changed above ?)  In kates in box

## 2015-10-31 NOTE — Telephone Encounter (Signed)
Changes made as requested, paperwork placed on [employer]

## 2015-11-01 NOTE — Telephone Encounter (Signed)
Spoke with Tresa Endo @ [facility]. She stating we didn't need  To do anything for STD pt wrote an appeal.    I need to refax fmla with the corrections  Paperwork faxed 11/01/15

## 2015-11-22 ENCOUNTER — Encounter (HOSPITAL_BASED_OUTPATIENT_CLINIC_OR_DEPARTMENT_OTHER): Payer: Self-pay | Admitting: *Deleted

## 2015-11-25 ENCOUNTER — Other Ambulatory Visit: Payer: Self-pay | Admitting: Orthopedic Surgery

## 2015-11-25 NOTE — H&P (Signed)
Lauren Love is an 30 y.o. female.   CC / Reason for Visit: Left small finger problem HPI: This patient is a 30 year old, right-hand-dominant, female who indicates that she was catching a basketball when she did so incorrectly and her her left small finger.  She was seen at [facility] [place] where she was x-rayed and found to have a proximal phalanx fracture of the right small finger.  She was placed into a aluminum splint and referred to [place] for further evaluation and treatment.  Past Medical History:  Diagnosis Date  . Alcohol abuse   . Anxiety   . Depression   . Finger fracture, left 10/2015   prox. phalanx - small finger    Past Surgical History:  Procedure Laterality Date  . CESAREAN SECTION    . GASTRIC BYPASS  2009    Family History  Problem Relation Age of Onset  . Alcohol abuse Mother   . Mental illness Mother   . Cancer Mother     Pituitary  . Alcohol abuse Father   . Hypertension Father   . Cancer Maternal Grandmother     Breast Cancer  . Alcohol abuse Paternal Grandfather    Social History:  reports that she has never smoked. She has never used smokeless tobacco. She reports that she drinks alcohol. She reports that she does not use drugs.  Allergies:  Allergies  Allergen Reactions  . Diflucan [Fluconazole] Hives    No prescriptions prior to admission.    No results found for this or any previous visit (from the past 48 hour(s)). No results found.  Review of Systems  All other systems reviewed and are negative.   Height 5\' 9"  (1.753 m), weight 106.1 kg (233 lb 12.8 oz), last menstrual period 11/17/2015. Physical Exam  Constitutional:  WD, WN, NAD HEENT:  NCAT, EOMI Neuro/Psych:  Alert & oriented to person, place, and time; appropriate mood & affect Lymphatic: No generalized UE edema or lymphadenopathy Extremities / MSK:  Both UE are normal with respect to appearance, ranges of motion, joint stability, muscle strength/tone,  sensation, & perfusion except as otherwise noted:  The left hand is swollen about the right small finger.  The patient is unable to flex the left small finger at the MP joint but it appears that with the small amount of flexion available there is malrotation.  The PIP and DIP flex normally.   Labs / X-rays: No radiographic studies obtained today.  3 views of the left small finger from 11/13/2015 were evaluated and demonstrated a closed, intra-articular, impacted, dorsally angulated, left small finger proximal phalanx neck fracture  Assessment:  Left small finger proximal phalanx fracture with angular and rotatory deformity  Plan:  The findings are discussed with the patient.  She wishes to proceed with surgical repair of the left small finger.  We are planning to proceed next Wednesday morning.  In the meantime, it is okay for her to buddy tape her digits for support/pain control.  The details of the operative procedure were discussed with the patient.  Questions were invited and answered.  In addition to the goal of the procedure, the risks of the procedure to include but not limited to bleeding; infection; damage to the nerves or blood vessels that could result in bleeding, numbness, weakness, chronic pain, and the need for additional procedures; stiffness; the need for revision surgery; and anesthetic risks were reviewed.  No specific outcome was guaranteed or implied.  Informed consent was obtained.  A prescription for Norco was given today, to help with pain before surgery, but specifically intended for postsurgical pain  Lauren Love A., MD 11/25/2015, 6:47 PM

## 2015-11-26 ENCOUNTER — Other Ambulatory Visit: Payer: Self-pay | Admitting: Primary Care

## 2015-11-26 DIAGNOSIS — F419 Anxiety disorder, unspecified: Principal | ICD-10-CM

## 2015-11-26 DIAGNOSIS — F329 Major depressive disorder, single episode, unspecified: Secondary | ICD-10-CM

## 2015-11-26 DIAGNOSIS — F32A Depression, unspecified: Secondary | ICD-10-CM

## 2015-11-26 NOTE — Telephone Encounter (Signed)
Ok to refill? Electronically refill request for buPROPion (WELLBUTRIN XL) 150 MG 24 hr tablet.  Last prescribed on 05/30/2015. Last seen on 10/11/2015. No future appt.

## 2015-11-27 ENCOUNTER — Ambulatory Visit (HOSPITAL_BASED_OUTPATIENT_CLINIC_OR_DEPARTMENT_OTHER): Payer: Managed Care, Other (non HMO) | Admitting: Certified Registered"

## 2015-11-27 ENCOUNTER — Ambulatory Visit (HOSPITAL_BASED_OUTPATIENT_CLINIC_OR_DEPARTMENT_OTHER)
Admission: RE | Admit: 2015-11-27 | Discharge: 2015-11-27 | Disposition: A | Discharge status: Home / Self Care | Payer: Managed Care, Other (non HMO) | Source: Ambulatory Visit | Attending: Orthopedic Surgery | Admitting: Orthopedic Surgery

## 2015-11-27 ENCOUNTER — Encounter (HOSPITAL_BASED_OUTPATIENT_CLINIC_OR_DEPARTMENT_OTHER)
Admission: RE | Disposition: A | Discharge status: Home / Self Care | Payer: Self-pay | Source: Ambulatory Visit | Attending: Orthopedic Surgery

## 2015-11-27 ENCOUNTER — Ambulatory Visit (HOSPITAL_COMMUNITY): Payer: Managed Care, Other (non HMO)

## 2015-11-27 ENCOUNTER — Encounter (HOSPITAL_BASED_OUTPATIENT_CLINIC_OR_DEPARTMENT_OTHER): Payer: Self-pay | Admitting: Certified Registered"

## 2015-11-27 DIAGNOSIS — S62617A Displaced fracture of proximal phalanx of left little finger, initial encounter for closed fracture: Secondary | ICD-10-CM | POA: Diagnosis present

## 2015-11-27 DIAGNOSIS — F419 Anxiety disorder, unspecified: Secondary | ICD-10-CM | POA: Diagnosis not present

## 2015-11-27 DIAGNOSIS — X58XXXA Exposure to other specified factors, initial encounter: Secondary | ICD-10-CM | POA: Diagnosis not present

## 2015-11-27 DIAGNOSIS — F329 Major depressive disorder, single episode, unspecified: Secondary | ICD-10-CM | POA: Diagnosis not present

## 2015-11-27 DIAGNOSIS — T148XXA Other injury of unspecified body region, initial encounter: Secondary | ICD-10-CM

## 2015-11-27 HISTORY — PX: OPEN REDUCTION INTERNAL FIXATION (ORIF) PROXIMAL PHALANX: SHX6235

## 2015-11-27 HISTORY — DX: Fracture of unspecified phalanx of unspecified finger, initial encounter for closed fracture: S62.609A

## 2015-11-27 HISTORY — DX: Alcohol abuse, uncomplicated: F10.10

## 2015-11-27 LAB — POCT PREGNANCY, URINE: Preg Test, Ur: NEGATIVE

## 2015-11-27 SURGERY — OPEN REDUCTION INTERNAL FIXATION (ORIF) PROXIMAL PHALANX
Anesthesia: General | Site: Finger | Laterality: Left

## 2015-11-27 MED ORDER — PROPOFOL 500 MG/50ML IV EMUL
INTRAVENOUS | Status: AC
  Filled 2015-11-27: qty 50

## 2015-11-27 MED ORDER — DEXAMETHASONE SODIUM PHOSPHATE 10 MG/ML IJ SOLN
INTRAMUSCULAR | Status: AC
  Filled 2015-11-27: qty 1

## 2015-11-27 MED ORDER — ONDANSETRON HCL 4 MG/2ML IJ SOLN
INTRAMUSCULAR | Status: AC
  Filled 2015-11-27: qty 2

## 2015-11-27 MED ORDER — EPHEDRINE 5 MG/ML INJ
INTRAVENOUS | Status: AC
  Filled 2015-11-27: qty 10

## 2015-11-27 MED ORDER — SCOPOLAMINE 1 MG/3DAYS TD PT72: 1.0000 | MEDICATED_PATCH | Freq: Once | TRANSDERMAL | Status: DC | PRN

## 2015-11-27 MED ORDER — MIDAZOLAM HCL 2 MG/2ML IJ SOLN
1.0000 mg | INTRAMUSCULAR | Status: DC | PRN
  Administered 2015-11-27: 2 mg via INTRAVENOUS

## 2015-11-27 MED ORDER — BUPIVACAINE-EPINEPHRINE 0.5% -1:200000 IJ SOLN
INTRAMUSCULAR | Status: DC | PRN
  Administered 2015-11-27: 10 mL

## 2015-11-27 MED ORDER — LACTATED RINGERS IV SOLN
INTRAVENOUS | Status: DC
  Administered 2015-11-27 (×2): via INTRAVENOUS

## 2015-11-27 MED ORDER — BUPIVACAINE-EPINEPHRINE (PF) 0.5% -1:200000 IJ SOLN
INTRAMUSCULAR | Status: AC
  Filled 2015-11-27: qty 30

## 2015-11-27 MED ORDER — OXYCODONE HCL 5 MG PO TABS
5.0000 mg | ORAL_TABLET | Freq: Once | ORAL | Status: AC | PRN
  Administered 2015-11-27: 5 mg via ORAL

## 2015-11-27 MED ORDER — 0.9 % SODIUM CHLORIDE (POUR BTL) OPTIME
TOPICAL | Status: DC | PRN
  Administered 2015-11-27: 200 mL

## 2015-11-27 MED ORDER — FENTANYL CITRATE (PF) 100 MCG/2ML IJ SOLN
50.0000 ug | INTRAMUSCULAR | Status: DC | PRN
  Administered 2015-11-27: 100 ug via INTRAVENOUS
  Administered 2015-11-27: 25 ug via INTRAVENOUS

## 2015-11-27 MED ORDER — CEFAZOLIN SODIUM-DEXTROSE 2-4 GM/100ML-% IV SOLN
2.0000 g | INTRAVENOUS | Status: AC
  Administered 2015-11-27: 2 g via INTRAVENOUS

## 2015-11-27 MED ORDER — MIDAZOLAM HCL 2 MG/2ML IJ SOLN
INTRAMUSCULAR | Status: AC
  Filled 2015-11-27: qty 2

## 2015-11-27 MED ORDER — MEPERIDINE HCL 25 MG/ML IJ SOLN: 6.2500 mg | INTRAMUSCULAR | Status: DC | PRN

## 2015-11-27 MED ORDER — OXYCODONE HCL 5 MG PO TABS
ORAL_TABLET | ORAL | Status: AC
  Filled 2015-11-27: qty 1

## 2015-11-27 MED ORDER — ONDANSETRON HCL 4 MG/2ML IJ SOLN
INTRAMUSCULAR | Status: DC | PRN
  Administered 2015-11-27: 4 mg via INTRAVENOUS

## 2015-11-27 MED ORDER — HYDROMORPHONE HCL 1 MG/ML IJ SOLN: 0.2500 mg | INTRAMUSCULAR | Status: DC | PRN

## 2015-11-27 MED ORDER — FENTANYL CITRATE (PF) 100 MCG/2ML IJ SOLN
INTRAMUSCULAR | Status: AC
  Filled 2015-11-27: qty 2

## 2015-11-27 MED ORDER — LIDOCAINE 2% (20 MG/ML) 5 ML SYRINGE
INTRAMUSCULAR | Status: AC
  Filled 2015-11-27: qty 5

## 2015-11-27 MED ORDER — ONDANSETRON HCL 4 MG/2ML IJ SOLN: 4.0000 mg | Freq: Once | INTRAMUSCULAR | Status: DC | PRN

## 2015-11-27 MED ORDER — CEFAZOLIN SODIUM-DEXTROSE 2-4 GM/100ML-% IV SOLN
INTRAVENOUS | Status: AC
  Filled 2015-11-27: qty 100

## 2015-11-27 MED ORDER — GLYCOPYRROLATE 0.2 MG/ML IJ SOLN: 0.2000 mg | Freq: Once | INTRAMUSCULAR | Status: DC | PRN

## 2015-11-27 MED ORDER — PROPOFOL 10 MG/ML IV BOLUS
INTRAVENOUS | Status: DC | PRN
  Administered 2015-11-27: 200 mg via INTRAVENOUS

## 2015-11-27 MED ORDER — LIDOCAINE HCL (CARDIAC) 20 MG/ML IV SOLN
INTRAVENOUS | Status: DC | PRN
  Administered 2015-11-27: 60 mg via INTRAVENOUS

## 2015-11-27 MED ORDER — DEXAMETHASONE SODIUM PHOSPHATE 10 MG/ML IJ SOLN
INTRAMUSCULAR | Status: DC | PRN
  Administered 2015-11-27: 10 mg via INTRAVENOUS

## 2015-11-27 MED ORDER — OXYCODONE HCL 5 MG/5ML PO SOLN: 5.0000 mg | Freq: Once | ORAL | Status: AC | PRN

## 2015-11-27 MED ORDER — LACTATED RINGERS IV SOLN: INTRAVENOUS | Status: DC

## 2015-11-27 SURGICAL SUPPLY — 61 items
BANDAGE COBAN STERILE 2 (GAUZE/BANDAGES/DRESSINGS) IMPLANT
BIT DRILL 1.1 (BIT) ×1
BIT DRILL 1.1MM (BIT) ×1
BIT DRILL 60X20X1.1XQC TMX (BIT) ×1 IMPLANT
BIT DRL 60X20X1.1XQC TMX (BIT) ×1
BLADE MINI RND TIP GREEN BEAV (BLADE) IMPLANT
BLADE SURG 15 STRL LF DISP TIS (BLADE) ×1 IMPLANT
BLADE SURG 15 STRL SS (BLADE) ×2
BNDG COHESIVE 4X5 TAN STRL (GAUZE/BANDAGES/DRESSINGS) ×3 IMPLANT
BNDG ESMARK 4X9 LF (GAUZE/BANDAGES/DRESSINGS) ×3 IMPLANT
BNDG GAUZE ELAST 4 BULKY (GAUZE/BANDAGES/DRESSINGS) ×3 IMPLANT
BONE CHIP PRESERV 5CC PCAN5 (Bone Implant) ×3 IMPLANT
CAP PIN PROTECTOR ORTHO WHT (CAP) IMPLANT
CHLORAPREP W/TINT 26ML (MISCELLANEOUS) ×3 IMPLANT
CORDS BIPOLAR (ELECTRODE) ×3 IMPLANT
COVER BACK TABLE 60X90IN (DRAPES) ×3 IMPLANT
COVER MAYO STAND STRL (DRAPES) ×3 IMPLANT
CUFF TOURNIQUET SINGLE 18IN (TOURNIQUET CUFF) IMPLANT
CUFF TOURNIQUET SINGLE 24IN (TOURNIQUET CUFF) ×3 IMPLANT
DRAPE C-ARM 42X72 X-RAY (DRAPES) ×3 IMPLANT
DRAPE EXTREMITY T 121X128X90 (DRAPE) ×3 IMPLANT
DRAPE SURG 17X23 STRL (DRAPES) ×3 IMPLANT
DRIVER BIT 1.5 (TRAUMA) ×6 IMPLANT
DRSG EMULSION OIL 3X3 NADH (GAUZE/BANDAGES/DRESSINGS) ×3 IMPLANT
GAUZE SPONGE 4X4 12PLY STRL (GAUZE/BANDAGES/DRESSINGS) ×3 IMPLANT
GLOVE BIO SURGEON STRL SZ7.5 (GLOVE) ×3 IMPLANT
GLOVE BIOGEL PI IND STRL 7.0 (GLOVE) ×2 IMPLANT
GLOVE BIOGEL PI IND STRL 8 (GLOVE) ×1 IMPLANT
GLOVE BIOGEL PI INDICATOR 7.0 (GLOVE) ×4
GLOVE BIOGEL PI INDICATOR 8 (GLOVE) ×2
GLOVE ECLIPSE 6.5 STRL STRAW (GLOVE) ×6 IMPLANT
GOWN STRL REUS W/ TWL LRG LVL3 (GOWN DISPOSABLE) ×2 IMPLANT
GOWN STRL REUS W/TWL LRG LVL3 (GOWN DISPOSABLE) ×4
GOWN STRL REUS W/TWL XL LVL3 (GOWN DISPOSABLE) ×3 IMPLANT
K-WIRE .045X4 (WIRE) IMPLANT
K-WIRE 9  SMOOTH .045 (WIRE) IMPLANT
LOCK SCREW 1.5X9MM (Screw) ×3 IMPLANT
NEEDLE HYPO 25X1 1.5 SAFETY (NEEDLE) ×3 IMPLANT
NS IRRIG 1000ML POUR BTL (IV SOLUTION) ×3 IMPLANT
PACK BASIN DAY SURGERY FS (CUSTOM PROCEDURE TRAY) ×3 IMPLANT
PADDING CAST ABS 4INX4YD NS (CAST SUPPLIES) ×2
PADDING CAST ABS COTTON 4X4 ST (CAST SUPPLIES) ×1 IMPLANT
PLATE T SMALL 1.5MM (Plate) ×3 IMPLANT
RUBBERBAND STERILE (MISCELLANEOUS) ×3 IMPLANT
SCREW L 1.5X12 (Screw) ×3 IMPLANT
SCREW LOCK 1.5X9MM (Screw) ×1 IMPLANT
SCREW LOCKING 1.5X10 (Screw) ×9 IMPLANT
SCREW LOCKING 1.5X13MM (Screw) ×3 IMPLANT
SPLINT PLASTER CAST XFAST 3X15 (CAST SUPPLIES) ×7 IMPLANT
SPLINT PLASTER XTRA FASTSET 3X (CAST SUPPLIES) ×14
STOCKINETTE 6  STRL (DRAPES) ×2
STOCKINETTE 6 STRL (DRAPES) ×1 IMPLANT
SUT VIC AB 4-0 RB1 27 (SUTURE) ×2
SUT VIC AB 4-0 RB1 27X BRD (SUTURE) ×1 IMPLANT
SUT VICRYL RAPIDE 4-0 (SUTURE) IMPLANT
SUT VICRYL RAPIDE 4/0 PS 2 (SUTURE) ×3 IMPLANT
SYR BULB 3OZ (MISCELLANEOUS) ×3 IMPLANT
SYRINGE 10CC LL (SYRINGE) IMPLANT
TOWEL OR 17X24 6PK STRL BLUE (TOWEL DISPOSABLE) ×3 IMPLANT
TOWEL OR NON WOVEN STRL DISP B (DISPOSABLE) ×3 IMPLANT
UNDERPAD 30X30 (UNDERPADS AND DIAPERS) ×3 IMPLANT

## 2015-11-27 NOTE — Anesthesia Preprocedure Evaluation (Addendum)
Anesthesia Evaluation  Patient identified by MRN, date of birth, ID band Patient awake    Reviewed: Allergy & Precautions, NPO status , Patient's Chart, lab work & pertinent test results  Airway Mallampati: I  TM Distance: >3 FB Neck ROM: Full    Dental  (+) Teeth Intact, Dental Advisory Given   Pulmonary    breath sounds clear to auscultation       Cardiovascular  Rhythm:Regular Rate:Normal     Neuro/Psych PSYCHIATRIC DISORDERS Anxiety Depression    GI/Hepatic   Endo/Other    Renal/GU      Musculoskeletal   Abdominal   Peds  Hematology   Anesthesia Other Findings   Reproductive/Obstetrics                            Anesthesia Physical Anesthesia Plan  ASA: I  Anesthesia Plan: General   Post-op Pain Management: GA combined w/ Regional for post-op pain   Induction: Intravenous  Airway Management Planned: LMA  Additional Equipment:   Intra-op Plan:   Post-operative Plan: Extubation in OR  Informed Consent: I have reviewed the patients History and Physical, chart, labs and discussed the procedure including the risks, benefits and alternatives for the proposed anesthesia with the patient or authorized representative who has indicated his/her understanding and acceptance.   Dental advisory given  Plan Discussed with: CRNA, Anesthesiologist and Surgeon  Anesthesia Plan Comments:         Anesthesia Quick Evaluation

## 2015-11-27 NOTE — Interval H&P Note (Signed)
History and Physical Interval Note:  11/27/2015 8:36 AM  Lauren Love  has presented today for surgery, with the diagnosis of LEFT SMALL FINGER PROXIMAL PHALANX FRACTURE S62.615A  The various methods of treatment have been discussed with the patient and family. After consideration of risks, benefits and other options for treatment, the patient has consented to  Procedure(s): OPEN REDUCTION LATERAL FIXATION OF LEFT SMALL FINGER PROXIMAL PHALANX FRACTURE (Left) as a surgical intervention .  The patient's history has been reviewed, patient examined, no change in status, stable for surgery.  I have reviewed the patient's chart and labs.  Questions were answered to the patient's satisfaction.     Cayden Granholm A.

## 2015-11-27 NOTE — Transfer of Care (Signed)
Immediate Anesthesia Transfer of Care Note  Patient: Lauren Love  Procedure(s) Performed: Procedure(s): OPEN REDUCTION LATERAL FIXATION OF LEFT SMALL FINGER PROXIMAL PHALANX FRACTURE (Left)  Patient Location: PACU  Anesthesia Type:General  Level of Consciousness: awake and patient cooperative  Airway & Oxygen Therapy: Patient Spontanous Breathing and Patient connected to face mask oxygen  Post-op Assessment: Report given to RN and Post -op Vital signs reviewed and stable  Post vital signs: Reviewed and stable  Last Vitals:  Vitals:   11/27/15 0742  BP: 122/82  Pulse: 78  Resp: 18  Temp: 36.4 C    Last Pain:  Vitals:   11/27/15 0742  TempSrc: Oral         Complications: No apparent anesthesia complications

## 2015-11-27 NOTE — Op Note (Signed)
11/27/2015  8:36 AM  PATIENT:  Lauren Love  30 y.o. female  PRE-OPERATIVE DIAGNOSIS:  Left small finger displaced intra-articular proximal phalanx fracture  POST-OPERATIVE DIAGNOSIS:  Same  PROCEDURE:  ORIF left small finger proximal phalanx intra-articular fracture augmented with allograft  SURGEON: Cliffton Astersavid A. Janee Mornhompson, MD  PHYSICIAN ASSISTANT: Danielle RankinKirsten Schrader, OPA-C  ANESTHESIA:  general  SPECIMENS:  None  DRAINS:   None  EBL:  less than 50 mL  PREOPERATIVE INDICATIONS:  Lauren MussCourtney B Haught is a  30 y.o. female with a displaced left small finger proximal phalanx fracture that is intra-articular at the MPJ.  The risks benefits and alternatives were discussed with the patient preoperatively including but not limited to the risks of infection, bleeding, nerve injury, cardiopulmonary complications, the need for revision surgery, among others, and the patient verbalized understanding and consented to proceed.  OPERATIVE IMPLANTS: Biomet ALPS small T-plate  OPERATIVE PROCEDURE:  After receiving prophylactic antibiotics, the patient was escorted to the operative theatre and placed in a supine position.  General anesthesia was administered. A surgical "time-out" was performed during which the planned procedure, proposed operative site, and the correct patient identity were compared to the operative consent and agreement confirmed by the circulating nurse according to current facility policy.  Following application of a tourniquet to the operative extremity, the exposed skin was prepped with Chloraprep and draped in the usual sterile fashion.  The limb was exsanguinated with an Esmarch bandage and the tourniquet inflated to approximately 100mmHg higher than systolic BP.  A dorsal radial incision was made sharply coursing obliquely ulnarly at the ends to allow for development of a full-thickness flap.  The extensor mechanism was split longitudinally down the midline and reflected.  The  periosteum was split in similar fashion.  The fracture was identified, and found to be impacted.  It was disimpacted with a freer elevator.  A T plate was selected and shortened appropriately.  It was placed on the dorsum of the proximal phalanx. Allograft was placed into the fracture void that had been created when the fracture was disimpacted.  The plate was secured with locking screws.  The screws were extra-articular.  Final images were obtained.  The wound was irrigated.  The periosteum and extensor tendon both repaired with  4-0 Vicryl running suture.  The tourniquet was released,  Hemostasis obtained with bipolar electrocautery, and the skin was closed with 4-0 Vicryl Rapide interrupted sutures.  A splint dressing was applied.  She was taken to the recovery room in stable condition, breathing spontaneously.  DISPOSITION: She'll be discharged home today, with typical postop instructions, returning to therapy in 5-7 days for a protective splint fabrication and initiation of rehabilitation.  RTC 10-15 days with me for splint and wound check, assessment of motion, and new x-rays of the left small finger out of splint.

## 2015-11-27 NOTE — Discharge Instructions (Signed)
Discharge Instructions   You have a dressing with a plaster splint incorporated in it. Move your fingers as much as possible, making a full fist and fully opening the fist. Elevate your hand to reduce pain & swelling of the digits.  Ice over the operative site may be helpful to reduce pain & swelling.  DO NOT USE HEAT. Pain medicine has been prescribed for you.  Use your medicine as needed over the first 48 hours, and then you can begin to taper your use.  You may use Tylenol in place of your prescribed pain medication, but not IN ADDITION to it. Leave the dressing in place until you return to our office.  You may shower, but keep the bandage clean & dry.  You may drive a car when you are off of prescription pain medications and can safely control your vehicle with both hands. Our office will call you to arrange a follow up appointment.   Post Anesthesia Home Care Instructions  Activity: Get plenty of rest for the remainder of the day. A responsible adult should stay with you for 24 hours following the procedure.  For the next 24 hours, DO NOT: -Drive a car -Advertising copywriterperate machinery -Drink alcoholic beverages -Take any medication unless instructed by your physician -Make any legal decisions or sign important papers.  Meals: Start with liquid foods such as gelatin or soup. Progress to regular foods as tolerated. Avoid greasy, spicy, heavy foods. If nausea and/or vomiting occur, drink only clear liquids until the nausea and/or vomiting subsides. Call your physician if vomiting continues.  Special Instructions/Symptoms: Your throat may feel dry or sore from the anesthesia or the breathing tube placed in your throat during surgery. If this causes discomfort, gargle with warm salt water. The discomfort should disappear within 24 hours.  If you had a scopolamine patch placed behind your ear for the management of post- operative nausea and/or vomiting:  1. The medication in the patch is effective  for 72 hours, after which it should be removed.  Wrap patch in a tissue and discard in the trash. Wash hands thoroughly with soap and water. 2. You may remove the patch earlier than 72 hours if you experience unpleasant side effects which may include dry mouth, dizziness or visual disturbances. 3. Avoid touching the patch. Wash your hands with soap and water after contact with the patch.     Please call [phone] during normal business hours or [phone] after hours for any problems. Including the following:  - excessive redness of the incisions - drainage for more than 4 days - fever of more than 101.5 F  *Please note that pain medications will not be refilled after hours or on weekends.

## 2015-11-27 NOTE — Anesthesia Postprocedure Evaluation (Signed)
Anesthesia Post Note  Patient: [name]  Procedure(s) Performed: Procedure(s) (LRB): OPEN REDUCTION LATERAL FIXATION OF LEFT SMALL FINGER PROXIMAL PHALANX FRACTURE (Left)  Patient location during evaluation: PACU Anesthesia Type: General Level of consciousness: awake and alert Pain management: pain level controlled Vital Signs Assessment: post-procedure vital signs reviewed and stable Respiratory status: spontaneous breathing, nonlabored ventilation and respiratory function stable Cardiovascular status: blood pressure returned to baseline and stable Postop Assessment: no signs of nausea or vomiting Anesthetic complications: no    Last Vitals:  Vitals:   11/27/15 1015 11/27/15 1045  BP: (!) 128/92 135/78  Pulse: 84 90  Resp: 13 16  Temp:  36.6 C    Last Pain:  Vitals:   11/27/15 1045  TempSrc:   PainSc: 2                  Mineola Duan A

## 2015-11-27 NOTE — Anesthesia Procedure Notes (Signed)
Procedure Name: LMA Insertion Date/Time: 11/27/2015 8:44 AM Performed by: Norvell Caswell D Pre-anesthesia Checklist: Patient identified, Emergency Drugs available, Suction available and Patient being monitored Patient Re-evaluated:Patient Re-evaluated prior to inductionOxygen Delivery Method: Circle system utilized Preoxygenation: Pre-oxygenation with 100% oxygen Intubation Type: IV induction Ventilation: Mask ventilation without difficulty LMA: LMA inserted LMA Size: 4.0 Number of attempts: 1 Airway Equipment and Method: Bite block Placement Confirmation: positive ETCO2 Tube secured with: Tape Dental Injury: Teeth and Oropharynx as per pre-operative assessment

## 2015-11-28 ENCOUNTER — Encounter (HOSPITAL_BASED_OUTPATIENT_CLINIC_OR_DEPARTMENT_OTHER): Payer: Self-pay | Admitting: Orthopedic Surgery

## 2015-11-29 ENCOUNTER — Telehealth: Payer: Self-pay | Admitting: *Deleted

## 2015-11-29 NOTE — Telephone Encounter (Signed)
This will need to wait until Kate's return for her to decide, if she needs to return to work prior to J. C. PenneyKate's return, she will need an appt to be seen by another provider.

## 2015-11-29 NOTE — Telephone Encounter (Signed)
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 Please let her know if she needs to schedule an appointment before getting this note or let her know when it is completed. [phone].

## 2015-12-03 ENCOUNTER — Telehealth: Payer: Self-pay | Admitting: Primary Care

## 2015-12-03 NOTE — Telephone Encounter (Signed)
Pt called requesting a back to work note for Monday 9/11. She had surgery on her hand and due to narcotics needs to be home, also developing a chest cold with coughing. Jae DireKate Clark's pt. Please call when ready.

## 2015-12-03 NOTE — Telephone Encounter (Signed)
Left detailed message on voicemail.  

## 2015-12-03 NOTE — Telephone Encounter (Signed)
She needs to contact her surgeon for the work note.

## 2016-01-09 ENCOUNTER — Encounter: Payer: Self-pay | Admitting: Emergency Medicine

## 2016-01-09 ENCOUNTER — Emergency Department
Admission: EM | Admit: 2016-01-09 | Discharge: 2016-01-09 | Disposition: A | Discharge status: Home / Self Care | Payer: Managed Care, Other (non HMO) | Attending: Emergency Medicine | Admitting: Emergency Medicine

## 2016-01-09 DIAGNOSIS — Z79899 Other long term (current) drug therapy: Secondary | ICD-10-CM | POA: Diagnosis not present

## 2016-01-09 DIAGNOSIS — F1290 Cannabis use, unspecified, uncomplicated: Secondary | ICD-10-CM | POA: Diagnosis not present

## 2016-01-09 DIAGNOSIS — R002 Palpitations: Secondary | ICD-10-CM | POA: Insufficient documentation

## 2016-01-09 DIAGNOSIS — R079 Chest pain, unspecified: Secondary | ICD-10-CM | POA: Diagnosis not present

## 2016-01-09 DIAGNOSIS — R109 Unspecified abdominal pain: Secondary | ICD-10-CM | POA: Insufficient documentation

## 2016-01-09 LAB — BASIC METABOLIC PANEL
ANION GAP: 18 — AB (ref 5–15)
BUN: 10 mg/dL (ref 6–20)
CALCIUM: 8.6 mg/dL — AB (ref 8.9–10.3)
CO2: 21 mmol/L — AB (ref 22–32)
Chloride: 96 mmol/L — ABNORMAL LOW (ref 101–111)
Creatinine, Ser: 0.75 mg/dL (ref 0.44–1.00)
GFR calc Af Amer: >60 mL/min (ref >60)
GFR calc non Af Amer: >60 mL/min (ref >60)
GLUCOSE: 104 mg/dL — AB (ref 65–99)
POTASSIUM: 3.4 mmol/L — AB (ref 3.5–5.1)
Sodium: 135 mmol/L (ref 135–145)

## 2016-01-09 LAB — CBC WITH DIFFERENTIAL/PLATELET
BASOS ABS: 0 10*3/uL (ref 0–0.1)
Basophils Relative: 1 %
Eosinophils Absolute: 0 10*3/uL (ref 0–0.7)
Eosinophils Relative: 0 %
HEMATOCRIT: 36 % (ref 35.0–47.0)
Hemoglobin: 12.3 g/dL (ref 12.0–16.0)
LYMPHS ABS: 0.9 10*3/uL — AB (ref 1.0–3.6)
LYMPHS PCT: 19 %
MCH: 31 pg (ref 26.0–34.0)
MCHC: 34.2 g/dL (ref 32.0–36.0)
MCV: 90.5 fL (ref 80.0–100.0)
MONO ABS: 0.4 10*3/uL (ref 0.2–0.9)
Monocytes Relative: 8 %
NEUTROS ABS: 3.4 10*3/uL (ref 1.4–6.5)
Neutrophils Relative %: 72 %
Platelets: 317 10*3/uL (ref 150–440)
RBC: 3.98 MIL/uL (ref 3.80–5.20)
RDW: 17.9 % — AB (ref 11.5–14.5)
WBC: 4.6 10*3/uL (ref 3.6–11.0)

## 2016-01-09 LAB — TROPONIN I: Troponin I: <0.03 ng/mL (ref <0.03)

## 2016-01-09 NOTE — ED Provider Notes (Signed)
[facility] Emergency Department Provider Note  ____________________________________________   First MD Initiated Contact with Patient 01/09/16 1532     (approximate)  I have reviewed the triage vital signs and the nursing notes.   HISTORY  Chief Complaint Palpitations   HPI Lauren Love is a 30 y.o. female with a history of depression as well as alcohol abuse who smoked marijuana today 1 PM. She says that soon after that she began having left-sided chest pressure as well as palpitations. She said that the pressure radiated down her left arm. She denies shortness of breath. Denies any nausea vomiting or diarrhea. Says the pain is gone at this time. Said that the pain lasted for a total of about one hour. Says that she hadn't smoked marijuana and about one year. Does not report any other substance abuse today and says that she is now feeling much improved.   Past Medical History:  Diagnosis Date  . Alcohol abuse   . Anxiety   . Depression   . Finger fracture, left 10/2015   prox. phalanx - small finger    Patient Active Problem List   Diagnosis Date Noted  . Alcoholism (HCC) 10/11/2015  . Alcohol abuse, daily use 07/09/2014  . Elevated blood pressure reading 07/09/2014  . History of Herpes 11/30/2012  . Anxiety and depression 11/30/2012    Past Surgical History:  Procedure Laterality Date  . CESAREAN SECTION    . GASTRIC BYPASS  2009  . OPEN REDUCTION INTERNAL FIXATION (ORIF) PROXIMAL PHALANX Left 11/27/2015   Procedure: OPEN REDUCTION LATERAL FIXATION OF LEFT SMALL FINGER PROXIMAL PHALANX FRACTURE;  Surgeon: Mack Hookavid Thompson, MD;  Location: [facility];  Service: Orthopedics;  Laterality: Left;    Prior to Admission medications   Medication Sig Start Date End Date Taking? Authorizing Provider  buPROPion (WELLBUTRIN XL) 150 MG 24 hr tablet TAKE 1 TABLET BY MOUTH DAILY 11/26/15   Doreene NestKatherine K Clark, NP  citalopram (CELEXA) 40 MG  tablet TAKE 1 TABLET(40 MG) BY MOUTH DAILY 07/19/15   Doreene NestKatherine K Clark, NP  DHA-EPA-Coenzyme Q10-Vitamin E 120-180-50-30 CAPS Take 1 capsule by mouth daily.    Historical Provider, MD  furosemide (LASIX) 20 MG tablet Take 20 mg by mouth.    Historical Provider, MD  gabapentin (NEURONTIN) 300 MG capsule Take 300 mg by mouth 3 (three) times daily.    Historical Provider, MD  Multiple Vitamin (MULTIVITAMIN) tablet Take 1 tablet by mouth daily.    Historical Provider, MD  naltrexone (DEPADE) 50 MG tablet Take 50 mg by mouth daily.    Historical Provider, MD  thiamine 100 MG tablet Take 100 mg by mouth daily.    Historical Provider, MD  traZODone (DESYREL) 100 MG tablet Take 100 mg by mouth at bedtime.    Historical Provider, MD    Allergies Diflucan [fluconazole]  Family History  Problem Relation Age of Onset  . Alcohol abuse Mother   . Mental illness Mother   . Cancer Mother     Pituitary  . Alcohol abuse Father   . Hypertension Father   . Cancer Maternal Grandmother     Breast Cancer  . Alcohol abuse Paternal Grandfather     Social History Social History  Substance Use Topics  . Smoking status: Never Smoker  . Smokeless tobacco: Never Used  . Alcohol use 0.0 oz/week    Review of Systems Constitutional: No fever/chills Eyes: No visual changes. ENT: No sore throat. Cardiovascular: Denies chest pain.  Respiratory: Denies shortness of breath. Gastrointestinal: Mild and diffuse abdominal pain after recent abdominal surgery several weeks ago. The patient says that she is a history of gastric bypass and said that they "had to go back in." She is unsure if this was for a hernia.  No nausea, no vomiting.  No diarrhea.  No constipation. Genitourinary: Negative for dysuria. Musculoskeletal: Negative for back pain. Skin: Negative for rash. Neurological: Negative for headaches, focal weakness or numbness.  10-point ROS otherwise  negative.  ____________________________________________   PHYSICAL EXAM:  VITAL SIGNS: ED Triage Vitals  Enc Vitals Group     BP 01/09/16 1500 (!) 140/94     Pulse Rate 01/09/16 1500 87     Resp 01/09/16 1500 18     Temp 01/09/16 1500 97.8 F (36.6 C)     Temp Source 01/09/16 1500 Oral     SpO2 01/09/16 1500 100 %     Weight 01/09/16 1454 228 lb (103.4 kg)     Height 01/09/16 1454 5\' 11"  (1.803 m)     Head Circumference --      Peak Flow --      Pain Score 01/09/16 1454 0     Pain Loc --      Pain Edu? --      Excl. in GC? --     Constitutional: Alert and oriented. Well appearing and in no acute distress. Eyes: Conjunctivae are normal. PERRL. EOMI. Head: Atraumatic. Nose: No congestion/rhinnorhea. Mouth/Throat: Mucous membranes are moist.   Neck: No stridor.   Cardiovascular: Normal rate, regular rhythm. Grossly normal heart sounds. Respiratory: Normal respiratory effort.  No retractions. Lungs CTAB. Gastrointestinal: Soft and nontender. No distention. No CVA tenderness. Musculoskeletal: No lower extremity tenderness nor edema.  No joint effusions. Neurologic:  Normal speech and language. No gross focal neurologic deficits are appreciated. No gait instability. Skin:  Skin is warm, dry and intact. No rash noted. Psychiatric: Mood and affect are normal. Speech and behavior are normal.  ____________________________________________   LABS (all labs ordered are listed, but only abnormal results are displayed)  Labs Reviewed  CBC WITH DIFFERENTIAL/PLATELET - Abnormal; Notable for the following:       Result Value   RDW 17.9 (*)    Lymphs Abs 0.9 (*)    All other components within normal limits  BASIC METABOLIC PANEL - Abnormal; Notable for the following:    Potassium 3.4 (*)    Chloride 96 (*)    CO2 21 (*)    Glucose, Bld 104 (*)    Calcium 8.6 (*)    Anion gap 18 (*)    All other components within normal limits  TROPONIN I    ____________________________________________  EKG  ED ECG REPORT I, Arelia Longest, the attending physician, personally viewed and interpreted this ECG.   Date: 01/09/2016  EKG Time: 1457  Rate: 90   Rhythm: Normal Sinus rhythm  Axis: Normal  Intervals: Normal  ST&T Change: No ST segment elevation or depression. No abnormal T-wave inversion.  ____________________________________________  RADIOLOGY   ____________________________________________   PROCEDURES  Procedure(s) performed:   Procedures  Critical Care performed:   ____________________________________________   INITIAL IMPRESSION / ASSESSMENT AND PLAN / ED COURSE  Pertinent labs & imaging results that were available during my care of the patient were reviewed by me and considered in my medical decision making (see chart for details).  ----------------------------------------- 5:14 PM on 01/09/2016 -----------------------------------------  Patient is asymptomatic at this time. She is clinically  sober as well. Very reassuring lab work. Will be discharged home. Presentation likely secondary to the effects of marijuana. We discussed the dangers of marijuana and the patient says that she will no longer using drugs. We'll be following up with her primary care doctor.  Clinical Course     ____________________________________________   FINAL CLINICAL IMPRESSION(S) / ED DIAGNOSES  Chest pain. Palpations. Marijuana use.    NEW MEDICATIONS STARTED DURING THIS VISIT:  New Prescriptions   No medications on file     Note:  This document was prepared using Dragon voice recognition software and may include unintentional dictation errors.    Myrna Blazer, MD 01/09/16 1714

## 2016-01-09 NOTE — ED Triage Notes (Signed)
States had smoked some marijuana with neighbor earlier today, at around 1300.  Now patient feeling panicked, paranoid, and palpatations.

## 2016-04-30 ENCOUNTER — Other Ambulatory Visit (HOSPITAL_COMMUNITY)
Admission: RE | Admit: 2016-04-30 | Discharge: 2016-04-30 | Disposition: A | Discharge status: Home / Self Care | Payer: Managed Care, Other (non HMO) | Source: Ambulatory Visit | Attending: Primary Care | Admitting: Primary Care

## 2016-04-30 ENCOUNTER — Encounter: Payer: Self-pay | Admitting: Primary Care

## 2016-04-30 ENCOUNTER — Ambulatory Visit (INDEPENDENT_AMBULATORY_CARE_PROVIDER_SITE_OTHER): Payer: Managed Care, Other (non HMO) | Admitting: Primary Care

## 2016-04-30 ENCOUNTER — Telehealth: Payer: Self-pay | Admitting: *Deleted

## 2016-04-30 VITALS — BP 142/90 | HR 95 | Temp 98.0°F | Ht 69.0 in | Wt 228.0 lb

## 2016-04-30 DIAGNOSIS — Z1151 Encounter for screening for human papillomavirus (HPV): Secondary | ICD-10-CM | POA: Diagnosis not present

## 2016-04-30 DIAGNOSIS — Z113 Encounter for screening for infections with a predominantly sexual mode of transmission: Secondary | ICD-10-CM | POA: Diagnosis not present

## 2016-04-30 DIAGNOSIS — F419 Anxiety disorder, unspecified: Secondary | ICD-10-CM

## 2016-04-30 DIAGNOSIS — F329 Major depressive disorder, single episode, unspecified: Secondary | ICD-10-CM

## 2016-04-30 DIAGNOSIS — Z01419 Encounter for gynecological examination (general) (routine) without abnormal findings: Secondary | ICD-10-CM | POA: Diagnosis present

## 2016-04-30 DIAGNOSIS — F1010 Alcohol abuse, uncomplicated: Secondary | ICD-10-CM

## 2016-04-30 DIAGNOSIS — R102 Pelvic and perineal pain: Secondary | ICD-10-CM

## 2016-04-30 LAB — POC URINALSYSI DIPSTICK (AUTOMATED)
BILIRUBIN UA: NEGATIVE
Blood, UA: NEGATIVE
Glucose, UA: NEGATIVE
LEUKOCYTES UA: NEGATIVE
Nitrite, UA: NEGATIVE
PH UA: 6
Protein, UA: NEGATIVE
Spec Grav, UA: 1.025
Urobilinogen, UA: 2

## 2016-04-30 NOTE — Progress Notes (Signed)
Subjective:    Patient ID: Lauren Love, female    DOB: 13-May-1985, 31 y.o.   MRN: [id number]  HPI  Lauren Love is a 31 year old female who presents today with a chief complaint of pelvic pain. She feels her cervix hurting. Her pain began Saturday this past weekend. She has noticed vaginal discharge, dysuria, urgency. She denies vaginal itching, vaginal bleeding, nausea, vomiting, abdominal pain. 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   Review of Systems  Constitutional: Negative for fever.  Gastrointestinal: Negative for abdominal pain, nausea and vomiting.  Genitourinary: Positive for dysuria and pelvic pain. Negative for frequency, vaginal bleeding and vaginal discharge.       Past Medical History:  Diagnosis Date  . Alcohol abuse   . Anxiety   . Depression   . Finger fracture, left 10/2015   prox. phalanx - small finger     Social History   Social History  . Marital status: Significant Other    Spouse name: N/A  . Number of children: N/A  . Years of education: N/A   Occupational History  . Not on file.   Social History Main Topics  . Smoking status: Never Smoker  . Smokeless tobacco: Never Used  . Alcohol use Yes  . Drug use: Yes    Types: Marijuana  . Sexual activity: Yes    Birth control/ protection: Condom   Other Topics Concern  . Not on file   Social History Narrative   Engaged to be married.   Has one daughter. 63 years old.   Works at [employer].   Lives in [place].   Enjoys playing softball with [employer] team; baking with her daughter; being with fiance.     Past Surgical History:  Procedure Laterality Date  . CESAREAN SECTION    . GASTRIC BYPASS  2009  . OPEN REDUCTION INTERNAL FIXATION (ORIF) PROXIMAL PHALANX Left 11/27/2015   Procedure: OPEN REDUCTION LATERAL FIXATION OF LEFT SMALL FINGER PROXIMAL PHALANX FRACTURE;  Surgeon: Mack Hook, MD;  Location: [facility];  Service: Orthopedics;  Laterality: Left;    Family History  Problem Relation Age of Onset  . Alcohol abuse Mother   . Mental illness Mother   . Cancer Mother     Pituitary  . Alcohol abuse Father   . Hypertension Father   . Cancer Maternal Grandmother     Breast Cancer  . Alcohol abuse Paternal Grandfather     Allergies  Allergen Reactions  . Diflucan [Fluconazole] Hives    Current Outpatient Prescriptions on File Prior to Visit  Medication Sig Dispense Refill  . buPROPion (WELLBUTRIN XL) 150 MG 24 hr tablet TAKE 1 TABLET BY MOUTH DAILY 90 tablet 1  . citalopram (CELEXA) 40 MG tablet TAKE 1 TABLET(40 MG) BY MOUTH DAILY 90 tablet 1  . DHA-EPA-Coenzyme Q10-Vitamin E 120-180-50-30 CAPS Take 1 capsule by mouth daily.    Marland Kitchen gabapentin (NEURONTIN) 300 MG capsule Take 300 mg by mouth 3 (three) times daily.    . Multiple Vitamin (MULTIVITAMIN) tablet Take 1 tablet by mouth daily.    Marland Kitchen thiamine 100 MG tablet Take 100 mg by mouth daily.    . traZODone (DESYREL) 100 MG tablet Take 100 mg by mouth at bedtime.     No current facility-administered medications on file prior to visit.     BP (!) 142/90   Pulse 95   Temp 98  F (36.7 C) (Oral)   Ht 5\' 9"  (1.753 m)   Wt 228 lb (103.4 kg)   LMP 04/23/2016   SpO2 97%   BMI 33.67 kg/m    Objective:   Physical Exam  Constitutional: She appears well-nourished.  Neck: Neck supple.  Cardiovascular: Normal rate and regular rhythm.   Abdominal: Soft. Bowel sounds are normal. There is no tenderness. There is no CVA tenderness.  Genitourinary: There is no lesion on the right labia. There is no lesion on the left labia. Cervix exhibits discharge. Cervix exhibits no motion tenderness. Vaginal discharge found.  Skin: Skin is warm and dry.  Psychiatric:  Fidgety           Assessment & Plan:  Pelvic Pain:  Present x 5 days. Some dysuria, vaginal discharge. Appears uncomfortable today, fidgety. Abdominal exam  unremarkable.  Pap completed today. Wet prep and Gonorrhea/chlamydia completed and is pending. UA: Negative for leuks, nitrites, blood. Will check for other STD's per patient request.  Will await results. Morrie Sheldonlark,Haset Oaxaca Kendal, NP

## 2016-04-30 NOTE — Addendum Note (Signed)
Addended by: Alvina ChouWALSH, Kerston Landeck J on: 04/30/2016 04:13 PM   Modules accepted: Orders

## 2016-04-30 NOTE — Addendum Note (Signed)
Addended by: Tawnya CrookSAMBATH, Gethsemane Fischler on: 04/30/2016 10:46 AM   Modules accepted: Orders

## 2016-04-30 NOTE — Patient Instructions (Addendum)
We will notify you of your pap and vaginal swab tests once received.  Your urine does not show evidence for infection.  Please schedule a follow up office visit with me in 1 month to discuss your overall health.  It was a pleasure to see you today!

## 2016-04-30 NOTE — Progress Notes (Signed)
Pre visit review using our clinic review tool, if applicable. No additional management support is needed unless otherwise documented below in the visit note. 

## 2016-04-30 NOTE — Telephone Encounter (Signed)
PT asked about the medications that were supposed to be refilled during her visit today. [facility]

## 2016-05-01 LAB — CYTOLOGY - PAP
Adequacy: ABSENT
Diagnosis: NEGATIVE
HPV: NOT DETECTED

## 2016-05-01 LAB — GC/CHLAMYDIA PROBE AMP
CT PROBE, AMP APTIMA: NOT DETECTED
GC Probe RNA: NOT DETECTED

## 2016-05-01 LAB — WET PREP BY MOLECULAR PROBE
Candida species: NEGATIVE
Gardnerella vaginalis: POSITIVE — AB
Trichomonas vaginosis: POSITIVE — AB

## 2016-05-01 LAB — HIV ANTIBODY (ROUTINE TESTING W REFLEX): HIV 1&2 Ab, 4th Generation: NONREACTIVE

## 2016-05-01 LAB — RPR

## 2016-05-01 MED ORDER — TRAZODONE HCL 100 MG PO TABS: 100.0000 mg | ORAL_TABLET | Freq: Every evening | ORAL | 0 refills | Status: DC | PRN

## 2016-05-01 MED ORDER — BUPROPION HCL ER (XL) 150 MG PO TB24: 150.0000 mg | ORAL_TABLET | Freq: Every day | ORAL | 0 refills | Status: DC

## 2016-05-01 MED ORDER — GABAPENTIN 300 MG PO CAPS: 300.0000 mg | ORAL_CAPSULE | Freq: Three times a day (TID) | ORAL | 0 refills | Status: DC

## 2016-05-01 NOTE — Telephone Encounter (Signed)
Noted Rxs sent to pharmacy.

## 2016-05-01 NOTE — Telephone Encounter (Signed)
Patient stated that she would like refill of the wellbutrin, gabapentin, and trazodone. Patient stated that she though that the gabapentin was use for her anxiety.

## 2016-05-01 NOTE — Telephone Encounter (Signed)
No one mentioned to me that she needed refills, which one? She can also contact her pharmacy.

## 2016-05-02 ENCOUNTER — Other Ambulatory Visit: Payer: Self-pay | Admitting: Primary Care

## 2016-05-02 DIAGNOSIS — N760 Acute vaginitis: Secondary | ICD-10-CM

## 2016-05-02 DIAGNOSIS — A599 Trichomoniasis, unspecified: Secondary | ICD-10-CM

## 2016-05-02 DIAGNOSIS — B9689 Other specified bacterial agents as the cause of diseases classified elsewhere: Secondary | ICD-10-CM

## 2016-05-02 MED ORDER — METRONIDAZOLE 0.75 % EX GEL: CUTANEOUS | 0 refills | Status: AC

## 2016-05-02 MED ORDER — METRONIDAZOLE 500 MG PO TABS: ORAL_TABLET | ORAL | 0 refills | Status: AC

## 2016-05-21 ENCOUNTER — Telehealth: Payer: Self-pay

## 2016-05-21 DIAGNOSIS — T3695XA Adverse effect of unspecified systemic antibiotic, initial encounter: Principal | ICD-10-CM

## 2016-05-21 DIAGNOSIS — B379 Candidiasis, unspecified: Secondary | ICD-10-CM

## 2016-05-21 NOTE — Telephone Encounter (Signed)
Pt left v/m; pt finished abx and now has yeast infection; [facility]30[facility]16

## 2016-05-22 NOTE — Telephone Encounter (Signed)
Message left for patient to return my call.  

## 2016-05-22 NOTE — Telephone Encounter (Signed)
I see she has an allergy to fluconazole which is used to treat vaginal yeast infections. Has she used anything else RX strength in the past for vaginal yeast infections? Has she tried OTC treatment? Please remind her that we will see her on 03/08 for her CPE and retesting for trichomonas to ensure it has resolved.

## 2016-05-26 MED ORDER — FLUCONAZOLE 150 MG PO TABS: 150.0000 mg | ORAL_TABLET | Freq: Once | ORAL | 0 refills | Status: AC

## 2016-05-26 NOTE — Telephone Encounter (Signed)
Tried to call patient and could not leave message since voicemail box is full 

## 2016-05-26 NOTE — Telephone Encounter (Signed)
Noted, sent to pharmacy. Please have her take a dose of Benadryl with the fluconazole in case she did have a prior reaction. Please have her call me immediately if she does experience hives.

## 2016-05-26 NOTE — Telephone Encounter (Signed)
Spoken and notified patient of Kate's comments. Patient stated she is not sure if the fluconazole caused hives or it could another medication she was taking at that time. Patient is begging for Jae DireKate to send the Rx for Fluconazole for yeast infection.

## 2016-05-27 NOTE — Telephone Encounter (Signed)
Tried to call patient and could not leave message since voicemail box is full 

## 2016-06-04 ENCOUNTER — Encounter: Payer: Managed Care, Other (non HMO) | Admitting: Primary Care

## 2016-06-04 ENCOUNTER — Ambulatory Visit: Payer: Managed Care, Other (non HMO) | Admitting: Certified Nurse Midwife

## 2016-06-04 DIAGNOSIS — Z0289 Encounter for other administrative examinations: Secondary | ICD-10-CM

## 2016-07-17 ENCOUNTER — Other Ambulatory Visit: Payer: Self-pay | Admitting: Primary Care

## 2016-07-17 DIAGNOSIS — F329 Major depressive disorder, single episode, unspecified: Secondary | ICD-10-CM

## 2016-07-17 DIAGNOSIS — F419 Anxiety disorder, unspecified: Principal | ICD-10-CM

## 2016-07-17 NOTE — Telephone Encounter (Signed)
Ok to refill? Electronically refill request for citalopram (CELEXA) 40 MG tablet. Last prescribed on 07/19/2015. Last seen on 04/30/2016

## 2016-07-27 ENCOUNTER — Other Ambulatory Visit: Payer: Self-pay | Admitting: Primary Care

## 2016-07-27 DIAGNOSIS — F1010 Alcohol abuse, uncomplicated: Secondary | ICD-10-CM

## 2016-07-27 DIAGNOSIS — F329 Major depressive disorder, single episode, unspecified: Secondary | ICD-10-CM

## 2016-07-27 DIAGNOSIS — F419 Anxiety disorder, unspecified: Secondary | ICD-10-CM

## 2016-07-30 NOTE — Telephone Encounter (Signed)
Ok to refill? Electronically refill request. Last prescribed on 05/01/2016. Last seen on 04/30/2016

## 2016-07-31 NOTE — Telephone Encounter (Signed)
Message left for patient to return my call.  

## 2016-08-07 NOTE — Telephone Encounter (Signed)
Spoken to patient. She stated that she had lost her job and in the process of applying for Medicaid

## 2016-11-25 ENCOUNTER — Telehealth: Payer: Self-pay

## 2016-11-25 NOTE — Telephone Encounter (Signed)
Received form from the plasma center for medical information, however, patient did not sign the authorization form to release medical information to the plasma center. Will need this signed before we can send in the form. Form placed in Chan's inbox.

## 2016-11-25 NOTE — Telephone Encounter (Signed)
Pt left /vm requesting cb to verify received fax [facility] to verify medications. Pt request cb.

## 2016-11-25 NOTE — Telephone Encounter (Signed)
Spoken to patient and notified her that the form is in Kate's inbox for her review. Will let patient when it is ready.

## 2016-11-26 NOTE — Telephone Encounter (Signed)
Spoken to patient earlier this morning. Notified patient of Kate's comments. Patient verbalized understanding. She attached it on message through MyChart or re-fax form.

## 2016-12-18 ENCOUNTER — Other Ambulatory Visit: Payer: Self-pay | Admitting: Primary Care

## 2016-12-18 DIAGNOSIS — F329 Major depressive disorder, single episode, unspecified: Secondary | ICD-10-CM

## 2016-12-18 DIAGNOSIS — F419 Anxiety disorder, unspecified: Principal | ICD-10-CM

## 2018-02-25 IMAGING — RF DG C-ARM 61-120 MIN
1 series · 3 of 3 positions shown · non-contrast
Comparison: No prior.

CLINICAL DATA: ORIF .

EXAM:
DG C-ARM 61-120 MIN; LEFT LITTLE FINGER 2+V

[Series 1: run · 3 of 3 slices shown]
[im 1/3]
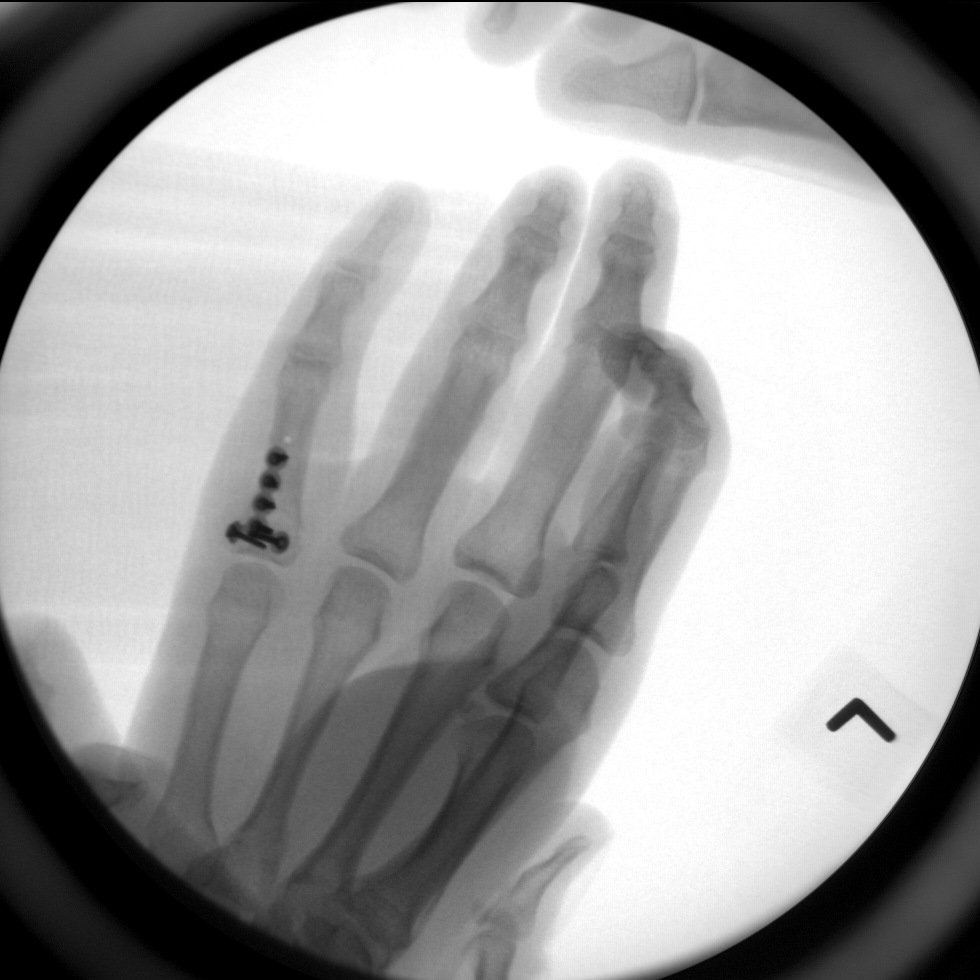
[im 2/3]
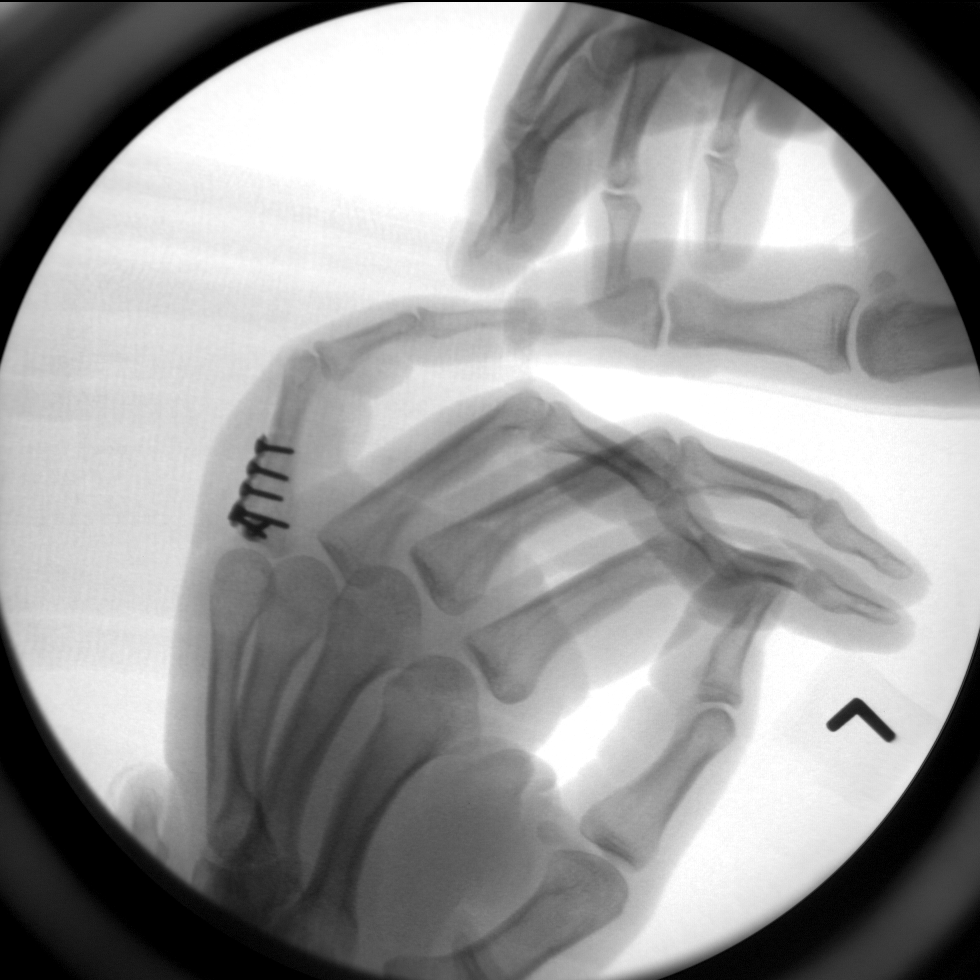
[im 3/3]
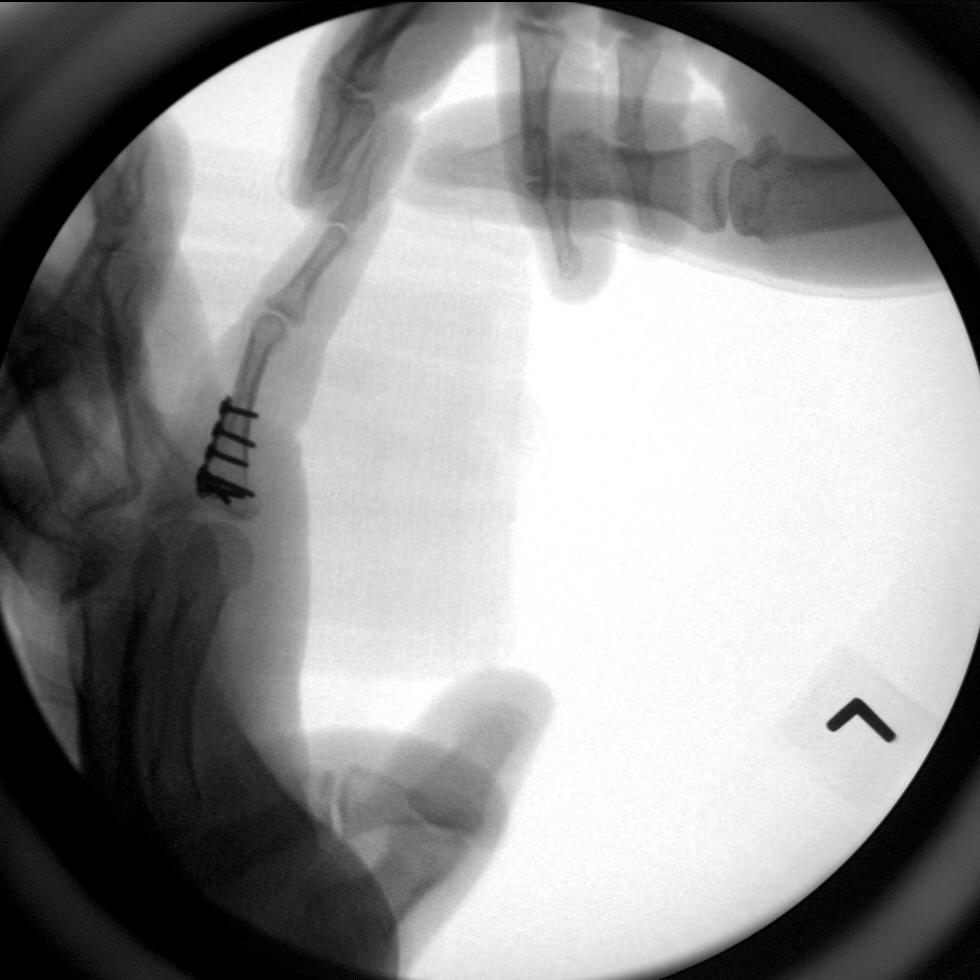

[3 of 3 positions shown; findings below may reference images not displayed]

FINDINGS: ORIF proximal phalanx left second digit. Hardware intact. Near
anatomic alignment.
IMPRESSION: ORIF proximal phalanx left second digit.

## 2019-05-01 DEATH — deceased
